# Patient Record
Sex: Male | Born: 1945 | Race: Black or African American | Hispanic: No | Smoking: Former smoker
Health system: Southern US, Community
[De-identification: ages and names within clinical notes are randomized; demographics above are authoritative.]

## PROBLEM LIST (undated history)

## (undated) DIAGNOSIS — I10 Essential (primary) hypertension: Secondary | ICD-10-CM

## (undated) DIAGNOSIS — C801 Malignant (primary) neoplasm, unspecified: Secondary | ICD-10-CM

## (undated) DIAGNOSIS — E349 Endocrine disorder, unspecified: Secondary | ICD-10-CM

## (undated) DIAGNOSIS — F419 Anxiety disorder, unspecified: Secondary | ICD-10-CM

## (undated) DIAGNOSIS — Z9889 Other specified postprocedural states: Secondary | ICD-10-CM

## (undated) DIAGNOSIS — K224 Dyskinesia of esophagus: Secondary | ICD-10-CM

## (undated) DIAGNOSIS — J302 Other seasonal allergic rhinitis: Secondary | ICD-10-CM

## (undated) DIAGNOSIS — E119 Type 2 diabetes mellitus without complications: Secondary | ICD-10-CM

## (undated) DIAGNOSIS — G4730 Sleep apnea, unspecified: Secondary | ICD-10-CM

## (undated) DIAGNOSIS — C61 Malignant neoplasm of prostate: Secondary | ICD-10-CM

## (undated) HISTORY — DX: Malignant neoplasm of prostate: C61

## (undated) HISTORY — PX: MOUTH SURGERY: SHX715

## (undated) HISTORY — PX: TONSILLECTOMY: SUR1361

---

## 2005-07-28 ENCOUNTER — Ambulatory Visit: Payer: Self-pay | Admitting: Family Medicine

## 2005-08-06 ENCOUNTER — Ambulatory Visit: Payer: Self-pay | Admitting: Family Medicine

## 2007-09-30 ENCOUNTER — Ambulatory Visit: Payer: Self-pay | Admitting: Gastroenterology

## 2010-06-25 ENCOUNTER — Ambulatory Visit: Payer: Self-pay | Admitting: Family Medicine

## 2010-07-19 ENCOUNTER — Other Ambulatory Visit (HOSPITAL_COMMUNITY): Payer: BC Managed Care – PPO

## 2010-07-23 ENCOUNTER — Ambulatory Visit (HOSPITAL_COMMUNITY): Admission: RE | Admit: 2010-07-23 | Payer: BC Managed Care – PPO | Source: Ambulatory Visit | Admitting: Neurosurgery

## 2012-07-28 ENCOUNTER — Ambulatory Visit: Payer: Self-pay | Admitting: Family Medicine

## 2013-04-23 ENCOUNTER — Emergency Department: Payer: Self-pay | Admitting: Internal Medicine

## 2014-11-19 ENCOUNTER — Emergency Department
Admission: EM | Admit: 2014-11-19 | Discharge: 2014-11-19 | Disposition: A | Discharge status: Home / Self Care | Payer: Medicare Other | Attending: Emergency Medicine | Admitting: Emergency Medicine

## 2014-11-19 ENCOUNTER — Encounter: Payer: Self-pay | Admitting: Emergency Medicine

## 2014-11-19 DIAGNOSIS — M7072 Other bursitis of hip, left hip: Secondary | ICD-10-CM | POA: Insufficient documentation

## 2014-11-19 DIAGNOSIS — E119 Type 2 diabetes mellitus without complications: Secondary | ICD-10-CM | POA: Diagnosis not present

## 2014-11-19 DIAGNOSIS — Z720 Tobacco use: Secondary | ICD-10-CM | POA: Insufficient documentation

## 2014-11-19 DIAGNOSIS — M25552 Pain in left hip: Secondary | ICD-10-CM | POA: Diagnosis present

## 2014-11-19 DIAGNOSIS — Y9389 Activity, other specified: Secondary | ICD-10-CM | POA: Diagnosis not present

## 2014-11-19 HISTORY — DX: Type 2 diabetes mellitus without complications: E11.9

## 2014-11-19 MED ORDER — MELOXICAM 15 MG PO TABS: 15.0000 mg | ORAL_TABLET | Freq: Every day | ORAL | Status: DC

## 2014-11-19 NOTE — ED Notes (Signed)
Left hip pain this am  States he did a lot of exercising and walking yesterday and developed pain this am pain is non radiating

## 2014-11-19 NOTE — ED Notes (Signed)
Patient reports left hip pain for a couple years getting much worse after mowing yesterday.  Patient denies any injury or fall.

## 2014-11-19 NOTE — ED Provider Notes (Signed)
[facility] Emergency Department Provider Note  ____________________________________________  Time seen: Approximately 10:58 AM  I have reviewed the triage vital signs and the nursing notes.   HISTORY  Chief Complaint Hip Pain    HPI Jacob Boyer is a 69 y.o. male presents to the emergency department complaining of left hip pain. 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 States that yesterday he was out and was mowing his 2 acre lawn with a push more. Last night he had some tenderness to his left hip and this morning he awoke with an increased pain. He denies taking any over-the-counter medications prior to arrival. He has never received cortisone injections into hip.   Past Medical History  Diagnosis Date  . Diabetes mellitus without complication ([place])     There are no active problems to display for this patient.   History reviewed. No pertinent past surgical history.  Current Outpatient Rx  Name  Route  Sig  Dispense  Refill  . meloxicam (MOBIC) 15 MG tablet   Oral   Take 1 tablet (15 mg total) by mouth daily.   30 tablet   0     Allergies Review of patient's allergies indicates no known allergies.  No family history on file.  Social History Social History  Substance Use Topics  . Smoking status: Current Some Day Smoker  . Smokeless tobacco: None  . Alcohol Use: Yes     Comment: occassional    Review of Systems Constitutional: No fever/chills Eyes: No visual changes. ENT: No sore throat. Cardiovascular: Denies chest pain. Respiratory: Denies shortness of breath. Gastrointestinal: No abdominal pain.  No nausea, no vomiting.  No diarrhea.  No constipation. Genitourinary: Negative for dysuria. Musculoskeletal: Negative for back pain. Endorses left hip pain. Skin: Negative for rash. Neurological: Negative for headaches, focal weakness or numbness.  10-point ROS otherwise  negative.  ____________________________________________   PHYSICAL EXAM:  VITAL SIGNS: ED Triage Vitals  Enc Vitals Group     BP 11/19/14 1036 133/85 mmHg     Pulse Rate 11/19/14 1036 85     Resp 11/19/14 1036 20     Temp 11/19/14 1036 98.1 F (36.7 C)     Temp Source 11/19/14 1036 Oral     SpO2 11/19/14 1036 98 %     Weight 11/19/14 1036 210 lb (95.255 kg)     Height 11/19/14 1036 5\' 6"  (1.676 m)     Head Cir --      Peak Flow --      Pain Score 11/19/14 1037 8     Pain Loc --      Pain Edu? --      Excl. in [place]? --     Constitutional: Alert and oriented. Well appearing and in no acute distress. Eyes: Conjunctivae are normal. PERRL. EOMI. Head: Atraumatic. Nose: No congestion/rhinnorhea. Mouth/Throat: Mucous membranes are moist.  Oropharynx non-erythematous. Neck: No stridor.   Cardiovascular: Normal rate, regular rhythm. Grossly normal heart sounds.  Good peripheral circulation. Respiratory: Normal respiratory effort.  No retractions. Lungs CTAB. Gastrointestinal: Soft and nontender. No distention. No abdominal bruits. No CVA tenderness. Musculoskeletal: No lower extremity tenderness nor edema.  No joint effusions. No visible abnormalities. Nontender to palpation over spine or paraspinal muscles. Patient does endorse tenderness when palpating greater trochanter. Range of motion is intact. Sensation and strength intact bilateral lower extremities. Neurologic:  Normal speech and language. No gross focal neurologic deficits are appreciated. No gait instability. Skin:  Skin is warm, dry and intact. No rash noted. Psychiatric: Mood and affect are normal. Speech and behavior are normal.  ____________________________________________   LABS (all labs ordered are listed, but only abnormal results are displayed)  Labs Reviewed - No data to  display ____________________________________________  EKG   ____________________________________________  RADIOLOGY   ____________________________________________   PROCEDURES  Procedure(s) performed: None  Critical Care performed: No  ____________________________________________   INITIAL IMPRESSION / ASSESSMENT AND PLAN / ED COURSE  Pertinent labs & imaging results that were available during my care of the patient were reviewed by me and considered in my medical decision making (see chart for details).  The patient's history, symptoms, exam is most consistent with left hip bursitis. Discussed findings and diagnosis with patient. He verbalized understanding of same. Advised the patient that I would place him on meloxicam for symptom relief. Gave instructions not to use any other NSAIDs on this medication. Instructed the patient to follow-up with orthopedics in 2-3 weeks if symptoms are not resolved with use of medication. Patient verbalized understanding of diagnosis, treatment plan, and verbalized compliance with same. ____________________________________________   FINAL CLINICAL IMPRESSION(S) / ED DIAGNOSES  Final diagnoses:  Bursitis of hip, left      Darletta Moll, PA-C 11/19/14 1126  Schuyler Amor, MD 11/19/14 1234

## 2014-11-19 NOTE — ED Notes (Signed)
States he is having left hip pain  Denies any injury but did do a lot of exercising yesterday  Ambulates with slight limp to triage

## 2014-11-19 NOTE — Discharge Instructions (Signed)
Bursitis °Bursitis is a swelling and soreness (inflammation) of a fluid-filled sac (bursa) that overlies and protects a joint. It can be caused by injury, overuse of the joint, arthritis or infection. The joints most likely to be affected are the elbows, shoulders, hips and knees. °HOME CARE INSTRUCTIONS  °· Apply ice to the affected area for 15-20 minutes each hour while awake for 2 days. Put the ice in a plastic bag and place a towel between the bag of ice and your skin. °· Rest the injured joint as much as possible, but continue to put the joint through a full range of motion, 4 times per day. (The shoulder joint especially becomes rapidly "frozen" if not used.) When the pain lessens, begin normal slow movements and usual activities. °· Only take over-the-counter or prescription medicines for pain, discomfort or fever as directed by your caregiver. °· Your caregiver may recommend draining the bursa and injecting medicine into the bursa. This may help the healing process. °· Follow all instructions for follow-up with your caregiver. This includes any orthopedic referrals, physical therapy and rehabilitation. Any delay in obtaining necessary care could result in a delay or failure of the bursitis to heal and chronic pain. °SEEK IMMEDIATE MEDICAL CARE IF:  °· Your pain increases even during treatment. °· You develop an oral temperature above 102° F (38.9° C) and have heat and inflammation over the involved bursa. °MAKE SURE YOU:  °· Understand these instructions. °· Will watch your condition. °· Will get help right away if you are not doing well or get worse. °Document Released: 02/01/2000 Document Revised: 04/28/2011 Document Reviewed: 04/25/2013 °ExitCare® Patient Information ©2015 [employer]. This information is not intended to replace advice given to you by your health care provider. Make sure you discuss any questions you have with your health care provider. ° °

## 2015-01-29 ENCOUNTER — Ambulatory Visit
Admission: RE | Admit: 2015-01-29 | Discharge: 2015-01-29 | Disposition: A | Discharge status: Home / Self Care | Payer: Medicare Other | Source: Ambulatory Visit | Attending: Physician Assistant | Admitting: Physician Assistant

## 2015-01-29 ENCOUNTER — Other Ambulatory Visit: Payer: Self-pay | Admitting: Physician Assistant

## 2015-01-29 DIAGNOSIS — R1319 Other dysphagia: Secondary | ICD-10-CM

## 2015-01-29 DIAGNOSIS — R1310 Dysphagia, unspecified: Secondary | ICD-10-CM | POA: Insufficient documentation

## 2015-01-29 DIAGNOSIS — Z9889 Other specified postprocedural states: Secondary | ICD-10-CM | POA: Insufficient documentation

## 2015-01-29 DIAGNOSIS — K449 Diaphragmatic hernia without obstruction or gangrene: Secondary | ICD-10-CM | POA: Diagnosis not present

## 2015-03-05 ENCOUNTER — Encounter: Payer: Self-pay | Admitting: *Deleted

## 2015-03-06 ENCOUNTER — Encounter: Payer: Self-pay | Admitting: *Deleted

## 2015-03-06 ENCOUNTER — Ambulatory Visit: Payer: Medicare Other | Admitting: *Deleted

## 2015-03-06 ENCOUNTER — Encounter
Admission: RE | Disposition: A | Discharge status: Home / Self Care | Payer: Self-pay | Source: Ambulatory Visit | Attending: Gastroenterology

## 2015-03-06 ENCOUNTER — Ambulatory Visit
Admission: RE | Admit: 2015-03-06 | Discharge: 2015-03-06 | Disposition: A | Discharge status: Home / Self Care | Payer: Medicare Other | Source: Ambulatory Visit | Attending: Gastroenterology | Admitting: Gastroenterology

## 2015-03-06 DIAGNOSIS — D124 Benign neoplasm of descending colon: Secondary | ICD-10-CM | POA: Insufficient documentation

## 2015-03-06 DIAGNOSIS — Z1211 Encounter for screening for malignant neoplasm of colon: Secondary | ICD-10-CM | POA: Insufficient documentation

## 2015-03-06 DIAGNOSIS — Z794 Long term (current) use of insulin: Secondary | ICD-10-CM | POA: Insufficient documentation

## 2015-03-06 DIAGNOSIS — D125 Benign neoplasm of sigmoid colon: Secondary | ICD-10-CM | POA: Diagnosis not present

## 2015-03-06 DIAGNOSIS — K621 Rectal polyp: Secondary | ICD-10-CM | POA: Diagnosis not present

## 2015-03-06 DIAGNOSIS — Z79899 Other long term (current) drug therapy: Secondary | ICD-10-CM | POA: Diagnosis not present

## 2015-03-06 DIAGNOSIS — K5730 Diverticulosis of large intestine without perforation or abscess without bleeding: Secondary | ICD-10-CM | POA: Insufficient documentation

## 2015-03-06 DIAGNOSIS — E119 Type 2 diabetes mellitus without complications: Secondary | ICD-10-CM | POA: Insufficient documentation

## 2015-03-06 DIAGNOSIS — I10 Essential (primary) hypertension: Secondary | ICD-10-CM | POA: Diagnosis not present

## 2015-03-06 DIAGNOSIS — G709 Myoneural disorder, unspecified: Secondary | ICD-10-CM | POA: Insufficient documentation

## 2015-03-06 DIAGNOSIS — Z7984 Long term (current) use of oral hypoglycemic drugs: Secondary | ICD-10-CM | POA: Diagnosis not present

## 2015-03-06 HISTORY — DX: Essential (primary) hypertension: I10

## 2015-03-06 HISTORY — DX: Endocrine disorder, unspecified: E34.9

## 2015-03-06 HISTORY — DX: Other seasonal allergic rhinitis: J30.2

## 2015-03-06 HISTORY — DX: Other specified postprocedural states: Z98.890

## 2015-03-06 HISTORY — DX: Dyskinesia of esophagus: K22.4

## 2015-03-06 HISTORY — PX: COLONOSCOPY WITH PROPOFOL: SHX5780

## 2015-03-06 LAB — GLUCOSE, CAPILLARY: Glucose-Capillary: 98 mg/dL (ref 65–99)

## 2015-03-06 SURGERY — COLONOSCOPY WITH PROPOFOL
Anesthesia: General

## 2015-03-06 MED ORDER — SODIUM CHLORIDE 0.9 % IV SOLN: INTRAVENOUS | Status: DC

## 2015-03-06 MED ORDER — PROPOFOL 10 MG/ML IV BOLUS
INTRAVENOUS | Status: DC | PRN
  Administered 2015-03-06: 120 mg via INTRAVENOUS
  Administered 2015-03-06: 40 mg via INTRAVENOUS

## 2015-03-06 MED ORDER — SODIUM CHLORIDE 0.9 % IV SOLN
INTRAVENOUS | Status: DC
  Administered 2015-03-06: 13:00:00 via INTRAVENOUS

## 2015-03-06 NOTE — Anesthesia Preprocedure Evaluation (Signed)
Anesthesia Evaluation  Patient identified by MRN, date of birth, ID band Patient awake    Reviewed: Allergy & Precautions, H&P , NPO status , Patient's Chart, lab work & pertinent test results  History of Anesthesia Complications Negative for: history of anesthetic complications  Airway Mallampati: III  TM Distance: >3 FB Neck ROM: limited    Dental  (+) Poor Dentition, Chipped   Pulmonary neg shortness of breath, former smoker,    Pulmonary exam normal breath sounds clear to auscultation       Cardiovascular Exercise Tolerance: Good hypertension, (-) angina(-) Past MI Normal cardiovascular exam Rhythm:regular Rate:Normal     Neuro/Psych  Neuromuscular disease negative psych ROS   GI/Hepatic negative GI ROS, Neg liver ROS,   Endo/Other  diabetes, Poorly Controlled  Renal/GU negative Renal ROS  negative genitourinary   Musculoskeletal   Abdominal   Peds  Hematology negative hematology ROS (+)   Anesthesia Other Findings Past Medical History:   Diabetes mellitus without complication (HCC)                 Esophageal motility disorder                                 Hypertension                                                 Hypotestosteronism                                           Seasonal allergic rhinitis                                   H/O cervical spine surgery                                  Past Surgical History:   TONSILLECTOMY                                                BMI    Body Mass Index   33.91 kg/m 2      Reproductive/Obstetrics negative OB ROS                             Anesthesia Physical Anesthesia Plan  ASA: III  Anesthesia Plan: General   Post-op Pain Management:    Induction:   Airway Management Planned:   Additional Equipment:   Intra-op Plan:   Post-operative Plan:   Informed Consent: I have reviewed the patients History and  Physical, chart, labs and discussed the procedure including the risks, benefits and alternatives for the proposed anesthesia with the patient or authorized representative who has indicated his/her understanding and acceptance.   Dental Advisory Given  Plan Discussed with: Anesthesiologist, CRNA and Surgeon  Anesthesia Plan Comments:         Anesthesia Quick Evaluation

## 2015-03-06 NOTE — H&P (Signed)
Outpatient short stay form Pre-procedure 03/06/2015 1:16 PM Lollie Sails MD  Primary Physician: Dr. Juluis Pitch   Reason for visit:  Colonoscopy  History of present illness:  Patient is a 70 year old male presenting today for colonoscopy. He has a personal history of adenomatous colon polyps with his last colonoscopy in 2009. He tolerated his prep well. He takes no aspirin products or blood thinners.     Current facility-administered medications:  .  0.9 %  sodium chloride infusion, , Intravenous, Continuous, Lollie Sails, MD, Last Rate: 20 mL/hr at 03/06/15 1310 .  0.9 %  sodium chloride infusion, , Intravenous, Continuous, Lollie Sails, MD  Prescriptions prior to admission  Medication Sig Dispense Refill Last Dose  . fluticasone (FLONASE) 50 MCG/ACT nasal spray Place into both nostrils daily.   03/05/2015 at Unknown time  . Fluticasone-Salmeterol (ADVAIR) 250-50 MCG/DOSE AEPB Inhale 1 puff into the lungs 2 (two) times daily.   03/05/2015 at Unknown time  . glimepiride (AMARYL) 4 MG tablet Take 4 mg by mouth 2 (two) times daily.   03/05/2015 at Unknown time  . insulin glargine (LANTUS) 100 UNIT/ML injection Inject 25 Units into the skin daily.   03/05/2015 at Unknown time  . loratadine-pseudoephedrine (CLARITIN-D 24-HOUR) 10-240 MG 24 hr tablet Take 1 tablet by mouth daily.   03/05/2015 at Unknown time  . losartan-hydrochlorothiazide (HYZAAR) 100-25 MG tablet Take 1 tablet by mouth daily.   03/05/2015 at Unknown time  . meloxicam (MOBIC) 15 MG tablet Take 1 tablet (15 mg total) by mouth daily. 30 tablet 0 03/05/2015 at Unknown time  . metFORMIN (GLUCOPHAGE) 850 MG tablet Take 850 mg by mouth 3 (three) times daily.   03/05/2015 at Unknown time  . omeprazole (PRILOSEC) 20 MG capsule Take 20 mg by mouth 2 (two) times daily.   03/05/2015 at Unknown time  . pioglitazone (ACTOS) 45 MG tablet Take 45 mg by mouth daily.   03/05/2015 at Unknown time  . tamsulosin (FLOMAX) 0.4 MG CAPS  capsule Take 0.4 mg by mouth daily.   03/05/2015 at Unknown time     No Known Allergies   Past Medical History  Diagnosis Date  . Diabetes mellitus without complication ([place])   . Esophageal motility disorder   . Hypertension   . Hypotestosteronism   . Seasonal allergic rhinitis   . H/O cervical spine surgery     Review of systems:      Physical Exam    Heart and lungs: Regular rate and rhythm without rub or gallop lungs are bilaterally clear.    HEENT: Normocephalic atraumatic eyes are anicteric    Other:     Pertinant exam for procedure: Soft nontender nondistended bowel sounds positive normoactive.    Planned proceedures: Colonoscopy and indicated procedures. I have discussed the risks benefits and complications of procedures to include not limited to bleeding, infection, perforation and the risk of sedation and the patient wishes to proceed.    Lollie Sails, MD Gastroenterology 03/06/2015  1:16 PM

## 2015-03-06 NOTE — Transfer of Care (Signed)
Immediate Anesthesia Transfer of Care Note  Patient: Jacob Boyer  Procedure(s) Performed: Procedure(s): COLONOSCOPY WITH PROPOFOL (N/A)  Patient Location: PACU  Anesthesia Type:General  Level of Consciousness: awake, alert  and oriented  Airway & Oxygen Therapy: Patient Spontanous Breathing and Patient connected to nasal cannula oxygen  Post-op Assessment: Report given to RN and Post -op Vital signs reviewed and stable  Post vital signs: Reviewed and stable  Last Vitals:  Filed Vitals:   03/06/15 1249 03/06/15 1409  BP: 147/91 125/89  Pulse: 71 75  Temp: 36 C 35.8 C  Resp: 18 16    Complications: No apparent anesthesia complications

## 2015-03-06 NOTE — Anesthesia Postprocedure Evaluation (Signed)
Anesthesia Post Note  Patient: Jacob Boyer  Procedure(s) Performed: Procedure(s) (LRB): COLONOSCOPY WITH PROPOFOL (N/A)  Patient location during evaluation: Endoscopy Anesthesia Type: General Level of consciousness: awake and alert Pain management: pain level controlled Vital Signs Assessment: post-procedure vital signs reviewed and stable Respiratory status: spontaneous breathing, nonlabored ventilation, respiratory function stable and patient connected to nasal cannula oxygen Cardiovascular status: blood pressure returned to baseline and stable Postop Assessment: no signs of nausea or vomiting Anesthetic complications: no    Last Vitals:  Filed Vitals:   03/06/15 1429 03/06/15 1439  BP: 131/89 137/95  Pulse: 53 76  Temp:    Resp: 15 15    Last Pain: There were no vitals filed for this visit.               Precious Haws Zaylyn Bergdoll

## 2015-03-06 NOTE — Op Note (Signed)
[facility] Patient Name: Jacob Boyer Procedure Date: 03/06/2015 1:28 PM MRN: [id number] Account #: [id number] Date of Birth: 01/10/1946 Admit Type: Outpatient Age: 70 Room: [facility] ENDO ROOM 3 Gender: Male Note Status: Finalized Procedure:         Colonoscopy Indications:       Personal history of colonic polyps Providers:         Lollie Sails, MD Referring MD:      Youlanda Roys. Lovie Macadamia, MD (Referring MD) Medicines:         Monitored Anesthesia Care Complications:     No immediate complications. Procedure:         Pre-Anesthesia Assessment:                    - ASA Grade Assessment: III - A patient with severe                     systemic disease.                    After obtaining informed consent, the colonoscope was                     passed under direct vision. Throughout the procedure, the                     patient's blood pressure, pulse, and oxygen saturations                     were monitored continuously. The Colonoscope was                     introduced through the anus and advanced to the the cecum,                     identified by appendiceal orifice and ileocecal valve. The                     colonoscopy was performed without difficulty. The patient                     tolerated the procedure well. The quality of the bowel                     preparation was good. Findings:      A 5 mm polyp was found in the proximal descending colon. The polyp was       sessile. The polyp was removed with a cold snare. Resection and       retrieval were complete.      A 3 mm polyp was found in the proximal descending colon. The polyp was       sessile. The polyp was removed with a cold biopsy forceps. Resection and       retrieval were complete.      A 4 mm polyp was found in the sigmoid colon. The polyp was sessile. The       polyp was removed with a cold snare. Resection and retrieval were       complete.      A 2 mm polyp was found  in the sigmoid colon. The polyp was sessile.       These polyps were removed with a cold biopsy forceps. Resection and       retrieval were complete.      Four  sessile polyps were found in the rectum. The polyps were 1 to 2 mm       in size. These polyps were removed with a cold biopsy forceps. Resection       and retrieval were complete.      Multiple small-mouthed diverticula were found in the sigmoid colon and       in the descending colon.      The digital rectal exam was normal. Impression:        - One 5 mm polyp in the proximal descending colon.                     Resected and retrieved.                    - One 3 mm polyp in the proximal descending colon.                     Resected and retrieved.                    - One 4 mm polyp in the sigmoid colon. Resected and                     retrieved.                    - One 2 mm polyp in the sigmoid colon. Resected and                     retrieved.                    - Four 1 to 2 mm polyps in the rectum. Resected and                     retrieved.                    - Diverticulosis in the sigmoid colon and in the                     descending colon. Recommendation:    - Discharge patient to home.                    - Telephone GI clinic for pathology results in 1 week. Procedure Code(s): --- Professional ---                    [phone], Colonoscopy, flexible; with removal of tumor(s),                     polyp(s), or other lesion(s) by snare technique                    [id number], 50, Colonoscopy, flexible; with biopsy, single or                     multiple Diagnosis Code(s): --- Professional ---                    D12.4, Benign neoplasm of descending colon                    D12.5, Benign neoplasm of sigmoid colon                    K62.1, Rectal polyp  Z86.010, Personal history of colonic polyps                    K57.30, Diverticulosis of large intestine without                     perforation or abscess  without bleeding CPT copyright 2014 [employer]. All rights reserved. The codes documented in this report are preliminary and upon coder review may  be revised to meet current compliance requirements. Lollie Sails, MD 03/06/2015 2:07:56 PM This report has been signed electronically. Number of Addenda: 0 Note Initiated On: 03/06/2015 1:28 PM Scope Withdrawal Time: 0 hours 17 minutes 7 seconds  Total Procedure Duration: 0 hours 27 minutes 22 seconds       [facility]

## 2015-03-07 ENCOUNTER — Encounter: Payer: Self-pay | Admitting: Gastroenterology

## 2015-03-08 LAB — SURGICAL PATHOLOGY

## 2017-02-05 DIAGNOSIS — Z794 Long term (current) use of insulin: Secondary | ICD-10-CM | POA: Insufficient documentation

## 2017-02-05 DIAGNOSIS — E119 Type 2 diabetes mellitus without complications: Secondary | ICD-10-CM | POA: Insufficient documentation

## 2017-08-18 ENCOUNTER — Encounter: Payer: Self-pay | Admitting: Urology

## 2017-08-18 ENCOUNTER — Ambulatory Visit (INDEPENDENT_AMBULATORY_CARE_PROVIDER_SITE_OTHER): Payer: Medicare Other | Admitting: Urology

## 2017-08-18 VITALS — BP 130/84 | HR 82 | Ht 66.0 in | Wt 210.0 lb

## 2017-08-18 DIAGNOSIS — R972 Elevated prostate specific antigen [PSA]: Secondary | ICD-10-CM | POA: Diagnosis not present

## 2017-08-18 DIAGNOSIS — N138 Other obstructive and reflux uropathy: Secondary | ICD-10-CM | POA: Diagnosis not present

## 2017-08-18 DIAGNOSIS — N401 Enlarged prostate with lower urinary tract symptoms: Secondary | ICD-10-CM

## 2017-08-18 LAB — URINALYSIS, COMPLETE
BILIRUBIN UA: NEGATIVE
Ketones, UA: NEGATIVE
LEUKOCYTES UA: NEGATIVE
Nitrite, UA: NEGATIVE
PH UA: 5 (ref 5.0–7.5)
PROTEIN UA: NEGATIVE
RBC, UA: NEGATIVE
Specific Gravity, UA: 1.025 (ref 1.005–1.030)
Urobilinogen, Ur: 0.2 mg/dL (ref 0.2–1.0)

## 2017-08-18 LAB — MICROSCOPIC EXAMINATION
RBC, UA: NONE SEEN /hpf (ref 0–2)
WBC, UA: NONE SEEN /hpf (ref 0–5)

## 2017-08-18 LAB — BLADDER SCAN AMB NON-IMAGING

## 2017-08-18 MED ORDER — TAMSULOSIN HCL 0.4 MG PO CAPS: 0.4000 mg | ORAL_CAPSULE | Freq: Every day | ORAL | 11 refills | Status: DC

## 2017-08-18 NOTE — Progress Notes (Signed)
08/18/2017 11:08 AM   Jacob Boyer Jul 12, 1945 [id number]  Referring provider: Juluis Pitch, MD [phone] [place], [place] [address]  Chief Complaint  Patient presents with  . Elevated PSA    New Patient    HPI: 72 year old male who presents today for further evaluation of elevated PSA.  He underwent recent routine PSA screening and was noted to have a markedly elevated PSA to 9.06 on 07/01/2017 (repeat PSA on 07/24/2017 8.5, free PSA 0.72) this is up significantly from 3.2 in 12/2013.  UA today is unremarkable.    No family history of prostate cancer.    He does have some baseline urinary symptoms including frequency, double voiding and sensation of incomplete emptying.  His stream is fair.  He has had urinary symptoms for many years.  He is not currently on BPH meds but may have tried Flomax several years ago.    No history recent UTIs or bladder stones.  He has never seen Urology.    No weight loss or bone pain.   IPSS    Row Name 08/18/17 1300         International Prostate Symptom Score   How often have you had the sensation of not emptying your bladder?  More than half the time     How often have you had to urinate less than every two hours?  More than half the time     How often have you found you stopped and started again several times when you urinated?  More than half the time     How often have you found it difficult to postpone urination?  Almost always     How often have you had a weak urinary stream?  Less than 1 in 5 times     How often have you had to strain to start urination?  Not at All     How many times did you typically get up at night to urinate?  3 Times     Total IPSS Score  21       Quality of Life due to urinary symptoms   If you were to spend the rest of your life with your urinary condition just the way it is now how would you feel about that?  Mostly Disatisfied        Score:  1-7 Mild 8-19 Moderate 20-35 Severe   PMH: Past  Medical History:  Diagnosis Date  . Diabetes mellitus without complication ([place])   . Esophageal motility disorder   . H/O cervical spine surgery   . Hypertension   . Hypotestosteronism   . Seasonal allergic rhinitis     Surgical History: Past Surgical History:  Procedure Laterality Date  . COLONOSCOPY WITH PROPOFOL N/A 03/06/2015   Procedure: COLONOSCOPY WITH PROPOFOL;  Surgeon: Lollie Sails, MD;  Location: [facility] ENDOSCOPY;  Service: Endoscopy;  Laterality: N/A;  . TONSILLECTOMY      Home Medications:  Allergies as of 08/18/2017   No Known Allergies     Medication List        Accurate as of 08/18/17 11:59 PM. Always use your most recent med list.          fluticasone 50 MCG/ACT nasal spray Commonly known as:  FLONASE Place into both nostrils daily.   Fluticasone-Salmeterol 250-50 MCG/DOSE Aepb Commonly known as:  ADVAIR Inhale 1 puff into the lungs 2 (two) times daily.   glimepiride 4 MG tablet Commonly known as:  AMARYL Take 4  mg by mouth 2 (two) times daily.   insulin glargine 100 UNIT/ML injection Commonly known as:  LANTUS Inject 25 Units into the skin daily.   loratadine-pseudoephedrine 10-240 MG 24 hr tablet Commonly known as:  CLARITIN-D 24-hour Take 1 tablet by mouth daily.   losartan-hydrochlorothiazide 100-25 MG tablet Commonly known as:  HYZAAR Take 1 tablet by mouth daily.   metFORMIN 850 MG tablet Commonly known as:  GLUCOPHAGE Take 850 mg by mouth 3 (three) times daily.   omeprazole 20 MG capsule Commonly known as:  PRILOSEC Take 20 mg by mouth 2 (two) times daily.   pioglitazone 45 MG tablet Commonly known as:  ACTOS Take 45 mg by mouth daily.   tamsulosin 0.4 MG Caps capsule Commonly known as:  FLOMAX Take 1 capsule (0.4 mg total) by mouth daily.       Allergies: No Known Allergies  Family History: Family History  Problem Relation Age of Onset  . Prostate cancer Neg Hx   . Kidney cancer Neg Hx   . Bladder Cancer Neg Hx       Social History:  reports that he has quit smoking. His smoking use included cigarettes. He has a 2.50 pack-year smoking history. He has never used smokeless tobacco. He reports that he drinks alcohol. He reports that he does not use drugs.  ROS: UROLOGY Frequent Urination?: Yes Hard to postpone urination?: Yes Burning/pain with urination?: No Get up at night to urinate?: Yes Leakage of urine?: Yes Urine stream starts and stops?: Yes Trouble starting stream?: No Do you have to strain to urinate?: No Blood in urine?: No Urinary tract infection?: No Sexually transmitted disease?: No Injury to kidneys or bladder?: No Painful intercourse?: No Weak stream?: No Erection problems?: Yes Penile pain?: No  Gastrointestinal Nausea?: No Vomiting?: No Indigestion/heartburn?: Yes Diarrhea?: No Constipation?: No  Constitutional Fever: No Night sweats?: No Weight loss?: No Fatigue?: Yes  Skin Skin rash/lesions?: No Itching?: No  Eyes Blurred vision?: Yes Double vision?: Yes  Ears/Nose/Throat Sore throat?: No Sinus problems?: Yes  Hematologic/Lymphatic Swollen glands?: No Easy bruising?: No  Cardiovascular Leg swelling?: No Chest pain?: No  Respiratory Cough?: Yes Shortness of breath?: No  Endocrine Excessive thirst?: No  Musculoskeletal Back pain?: Yes Joint pain?: No  Neurological Headaches?: No Dizziness?: No  Psychologic Depression?: Yes Anxiety?: Yes  Physical Exam: BP 130/84   Pulse 82   Ht 5\' 6"  (1.676 m)   Wt 210 lb (95.3 kg)   BMI 33.89 kg/m   Constitutional:  Alert and oriented, No acute distress. HEENT: [place] AT, moist mucus membranes.  Trachea midline, no masses. Cardiovascular: No clubbing, cyanosis, or edema. Respiratory: Normal respiratory effort, no increased work of breathing. GI: Abdomen is soft, nontender, nondistended, no abdominal masses GU: No CVA tenderness Rectal: Normal sphincter tone, unremarkable prostate, nontender, no  nodules.  External hemorrhoids appreciated. Skin: No rashes, bruises or suspicious lesions. Neurologic: Grossly intact, no focal deficits, moving all 4 extremities. Psychiatric: Normal mood and affect.  Laboratory Data: Labs from care everywhere reviewed Creatinine 1.1 on 07/01/2017 Hemoglobin A1c 10.1 PSA as above  Urinalysis UA reviewed today, 1+ glucose, otherwise negative  Pertinent Imaging: PVR 0 cc  Assessment & Plan:    1. Elevated PSA  We reviewed the implications of an elevated PSA and the uncertainty surrounding it. In general, a man's PSA increases with age and is produced by both normal and cancerous prostate tissue. Differential for elevated PSA is BPH, prostate cancer, infection, recent intercourse/ejaculation, prostate infarction,  recent urethroscopic manipulation (foley placement/cystoscopy) and prostatitis. Management of an elevated PSA can include observation or prostate biopsy and wediscussed this in detail. We discussed that indications for prostate biopsy are defined by age and race specific PSA cutoffs as well as a PSA velocity of 0.75/year.  At this point, his rectal exam is unremarkable other than some prostamegaly.  I would like to repeat his PSA in about 3 months to ensure that his PSA is truly elevated rather than secondary to inflammation or some other factor.  He is agreeable this plan and will return in 3 months with repeat PSA.  If his PSA remains elevated at this point time, would recommend proceeding with prostate biopsy.  He is agreeable this plan.  He understands the extreme importance of follow-up as discussed.  - Urinalysis, Complete  2. BPH with obstruction/lower urinary tract symptoms Poorly controlled urinary symptoms, likely multifactorial including history of poorly controlled diabetes, underlying BPH, and behavioral factors.  We will go ahead and send him on Flomax to see if this makes any difference in his urinary symptoms.  Plan to reassess  in 3 months.  Adequate bladder emptying today.  No evidence of UTI is contributing factor.  Return in about 3 months (around 11/18/2017) for PSA prior.  Hollice Espy, MD  [facility] [address], [place], [place] [address] [phone]

## 2017-10-10 IMAGING — RF DG ESOPHAGUS
8 series · 14 of 14 positions shown · non-contrast
Comparison: None.

CLINICAL DATA: Dysphagia.

EXAM:
ESOPHOGRAM/BARIUM SWALLOW
TECHNIQUE: Combined double contrast and single contrast examination performed
using effervescent crystals, thick barium liquid, and thin barium
liquid.
FLUOROSCOPY TIME:  Radiation Exposure Index (as provided by the
fluoroscopic device): 35.0 mGy

[Series 1: fluoro_barium 2fps_bw · 0.17mm/px · 4 of 9 frames shown (1 of 8)]
[frame 2/9]
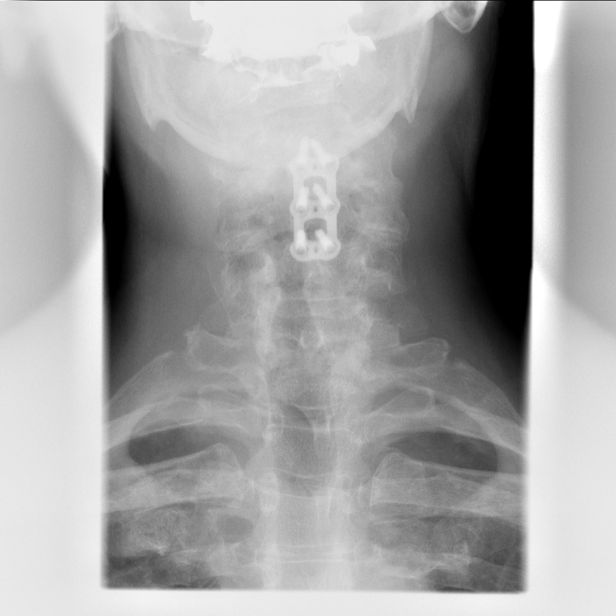
[frame 4/9]
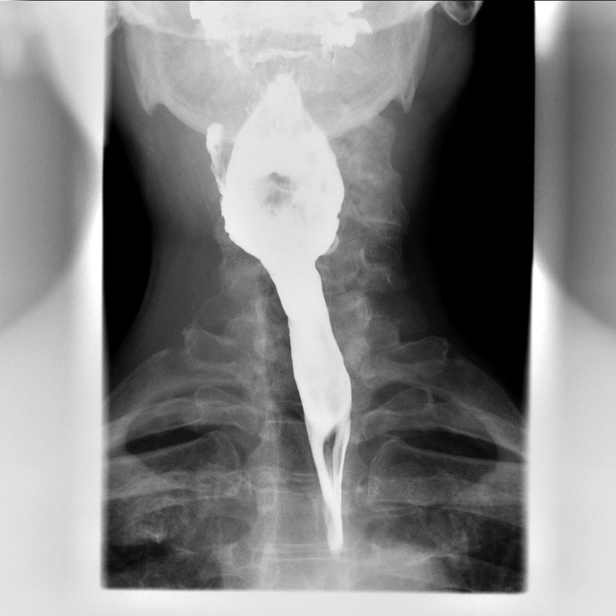
[frame 5/9]
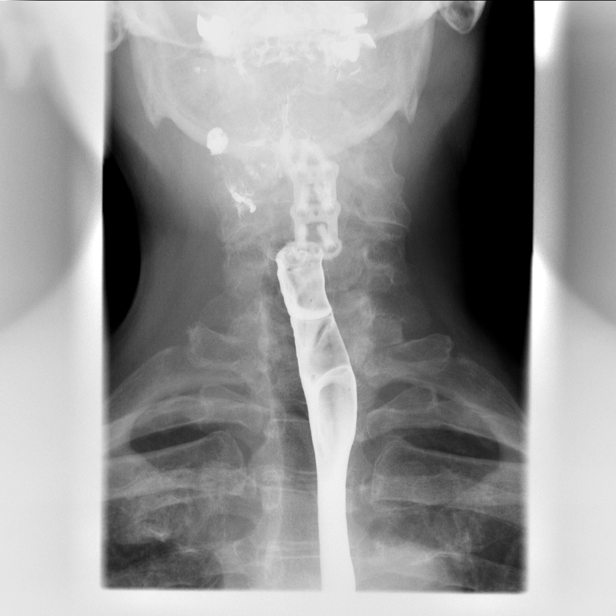
[frame 8/9]
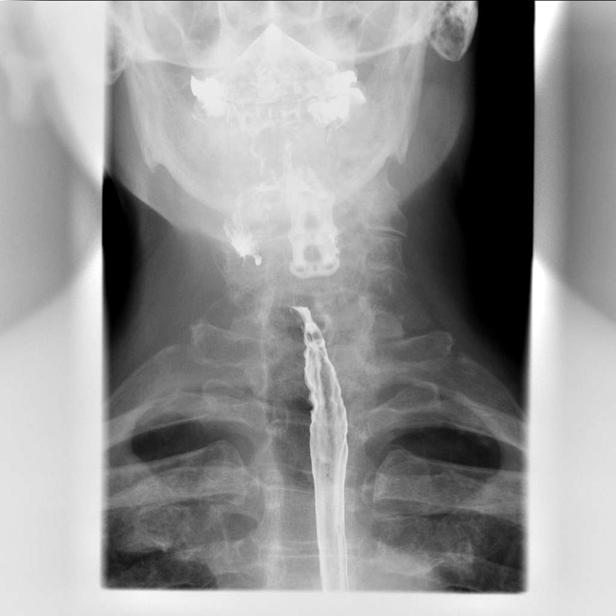

[Series 2: fluoro_barium 2fps_bw · 0.17mm/px · 4 of 8 frames shown (2 of 8)]
[frame 1/8]
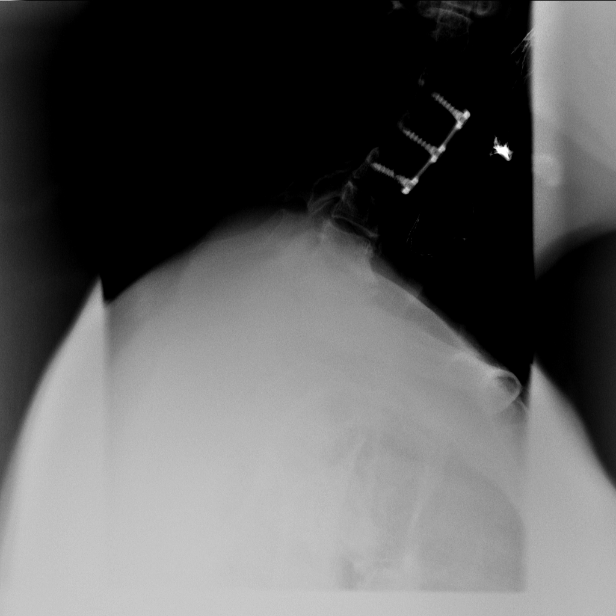
[frame 2/8]
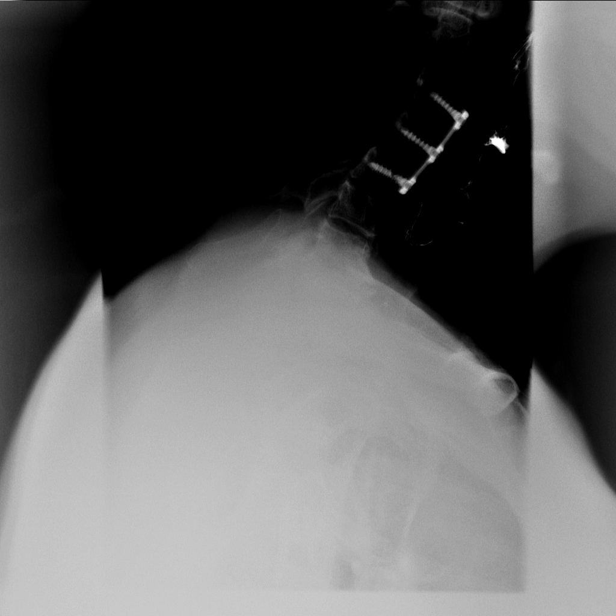
[frame 5/8]
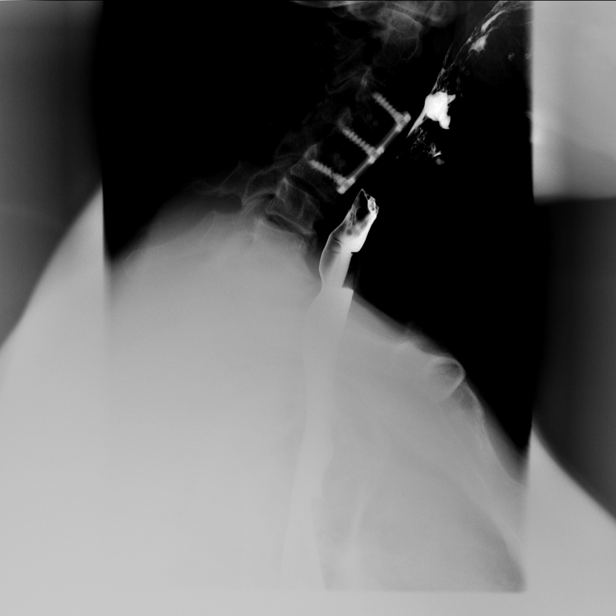
[frame 7/8]
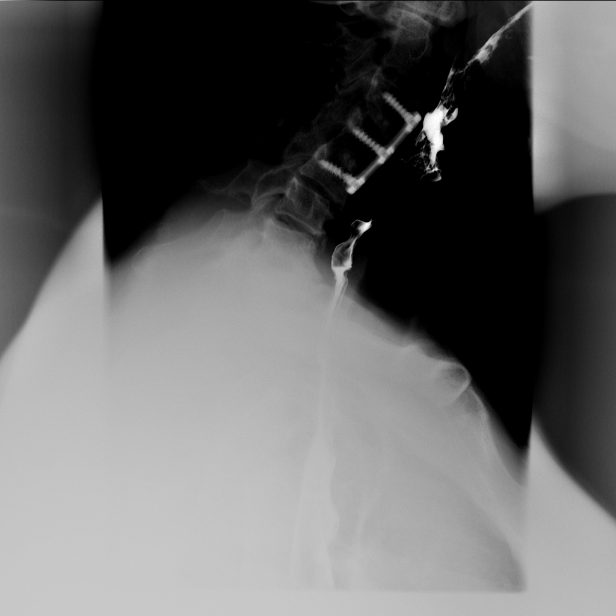

[Series 3: fluoro_barium 2fps_bw · 0.17mm/px · 1 of 1 slices shown (3 of 8)]
[im 1/1]
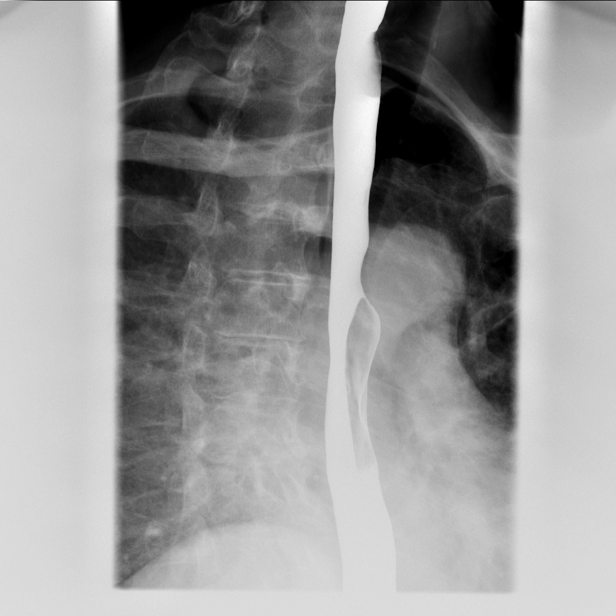

[Series 4: fluoro_barium 2fps_bw · 0.17mm/px · 1 of 1 slices shown (4 of 8)]
[im 1/1]
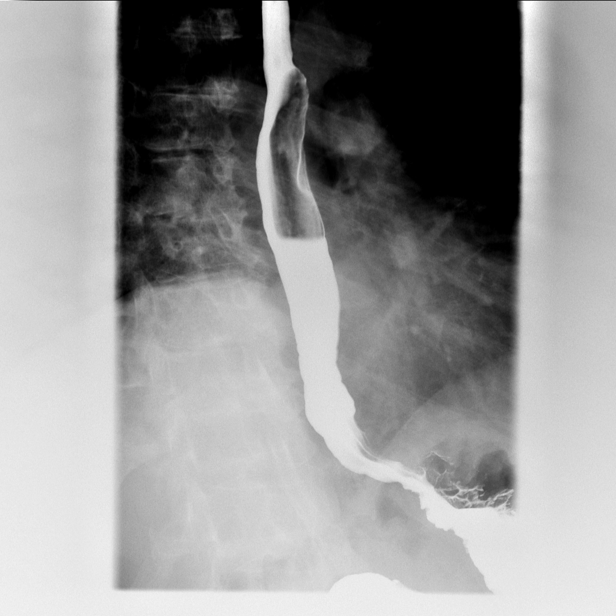

[Series 5: fluoro_barium 2fps_bw · 0.17mm/px · 1 of 1 slices shown (5 of 8)]
[im 1/1]
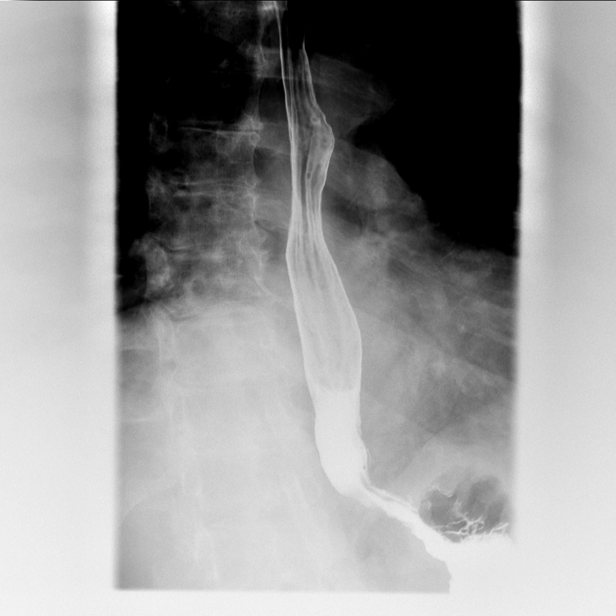

[Series 6: fluoro_barium 2fps_bw · 0.19mm/px · 1 of 1 slices shown (6 of 8)]
[im 1/1]
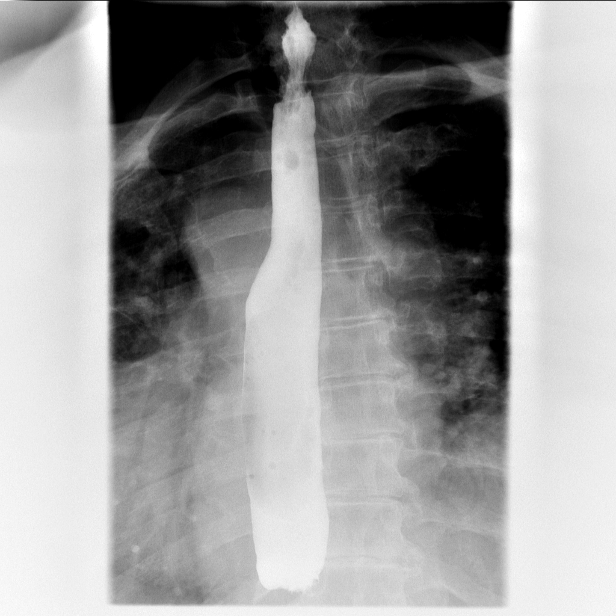

[Series 7: fluoro_barium 2fps_bw · 0.19mm/px · 1 of 1 slices shown (7 of 8)]
[im 1/1]
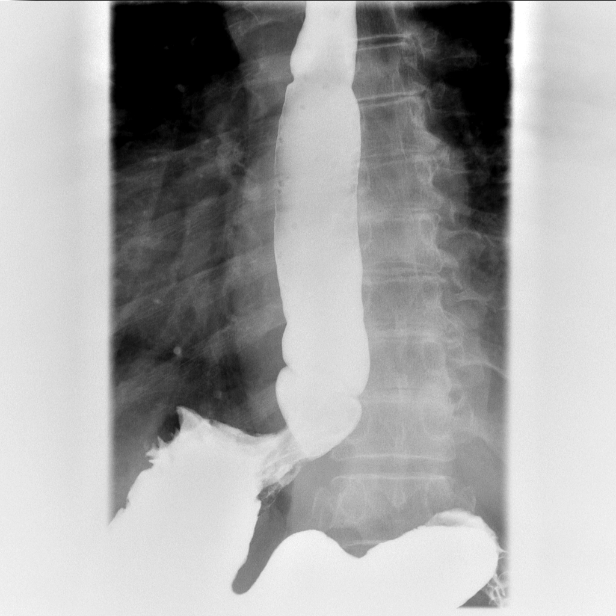

[Series 8: fluoro_barium 2fps_bw · 0.19mm/px · 1 of 1 slices shown (8 of 8)]
[im 1/1]
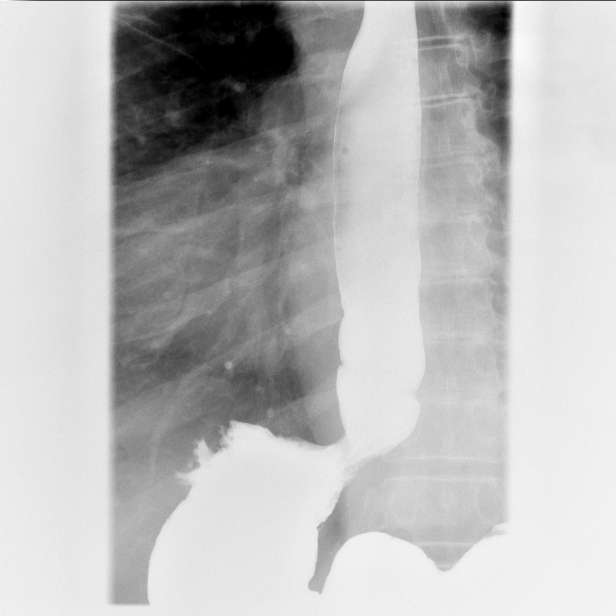

[14 of 14 positions shown; findings below may reference images not displayed]

FINDINGS: Mild deformity noted of the cervical esophagus secondary to
postsurgical changes and degenerative changes of the cervical spine.
Patient's loss normal cervical lordosis. Esophagus is widely patent.
There is a small sliding hiatal hernia with small B ring. Barium
tablet passed easily. No reflux.
IMPRESSION: 1. Mild deformity noted the cervical esophagus secondary to
postsurgical changes and degenerative changes of the cervical spine.
2. Small sliding hiatal hernia with small B ring. No obstructing
lesion identified. Standard barium tablet passed easily. No reflux .

## 2017-11-17 ENCOUNTER — Other Ambulatory Visit: Payer: Self-pay | Admitting: Family Medicine

## 2017-11-17 DIAGNOSIS — R972 Elevated prostate specific antigen [PSA]: Secondary | ICD-10-CM

## 2017-11-18 ENCOUNTER — Encounter: Payer: Self-pay | Admitting: Urology

## 2017-11-18 ENCOUNTER — Other Ambulatory Visit: Payer: Medicare Other

## 2017-11-24 ENCOUNTER — Ambulatory Visit: Payer: Medicare Other | Admitting: Urology

## 2017-11-24 ENCOUNTER — Telehealth: Payer: Self-pay | Admitting: Urology

## 2017-11-24 NOTE — Telephone Encounter (Signed)
This patient was on my schedule today to be seen for elevated PSA.  He fell off my schedule, presumably called and canceled.  Please call the patient and inform him that it is extremely imperative that he have his PSA repeated and is seen again.  His PSA was markedly elevated which is highly concerning.  Please make him aware of the possibility of missed diagnosis of prostate cancer.  Hollice Espy, MD

## 2017-11-25 NOTE — Telephone Encounter (Signed)
His brother passed away this morning. He is preparing for a funeral.  I rescheduled him but the only thing I was able to book was 01/12/18. I did inform him that we will call him if there is a cancellation earlier.

## 2018-01-12 ENCOUNTER — Ambulatory Visit: Payer: Self-pay | Admitting: Urology

## 2018-01-21 ENCOUNTER — Other Ambulatory Visit: Payer: Medicare Other

## 2018-01-26 ENCOUNTER — Ambulatory Visit: Payer: Medicare Other | Admitting: Urology

## 2018-02-23 ENCOUNTER — Other Ambulatory Visit: Payer: Medicare Other

## 2018-02-23 DIAGNOSIS — R972 Elevated prostate specific antigen [PSA]: Secondary | ICD-10-CM

## 2018-02-24 LAB — PSA: Prostate Specific Ag, Serum: 10.5 ng/mL — ABNORMAL HIGH (ref 0.0–4.0)

## 2018-03-02 ENCOUNTER — Ambulatory Visit (INDEPENDENT_AMBULATORY_CARE_PROVIDER_SITE_OTHER): Payer: Medicare Other | Admitting: Urology

## 2018-03-02 ENCOUNTER — Encounter: Payer: Self-pay | Admitting: Urology

## 2018-03-02 VITALS — BP 158/99 | HR 82 | Ht 66.0 in | Wt 185.0 lb

## 2018-03-02 DIAGNOSIS — R972 Elevated prostate specific antigen [PSA]: Secondary | ICD-10-CM | POA: Diagnosis not present

## 2018-03-02 DIAGNOSIS — N138 Other obstructive and reflux uropathy: Secondary | ICD-10-CM | POA: Diagnosis not present

## 2018-03-02 DIAGNOSIS — N401 Enlarged prostate with lower urinary tract symptoms: Secondary | ICD-10-CM

## 2018-03-02 NOTE — Progress Notes (Signed)
03/02/2018  2:30 PM   Jacob Boyer 1945-05-30 [id number]  Referring provider: Juluis Pitch, MD [phone] [place], [place] [address]  Chief Complaint  Patient presents with  . Elevated PSA    3 mo follow up   HPI: Jacob Boyer is a 73 y.o. male who returns today to discuss his options following his elevated PSA. His last appointment was 08/18/2017. He was scheduled to follow up sooner, but canceled multiple appointments due to family emergencies.  His most recent PSA (02/23/18) was 10.5. His PSA has been steadily increasing which raises concerns (see below).  Patient denies anticoagulant usage.   Patient reports that when he tried using Flomax he noted no change in his urinary symptoms. He has since discontinued use.  He has implemented diet and lifestyle changes that he believes have contributed to the improvement of his urinary symptoms. He reports a good stream. He notes that he rarely experiences frequency and has slightly improved urgency. He feels his symptoms are not bothersome enough for him to consider other pharmacological interventions at this time.  PSA Trend  12/2013 07/01/2017 07/24/2017 02/23/2018  Prostate Specific Antigen (ng/dL) 3.2 9.06 8.5 (free PSA 0.72) 10.5   PMH: Past Medical History:  Diagnosis Date  . Diabetes mellitus without complication ([place])   . Esophageal motility disorder   . H/O cervical spine surgery   . Hypertension   . Hypotestosteronism   . Seasonal allergic rhinitis     Surgical History: Past Surgical History:  Procedure Laterality Date  . COLONOSCOPY WITH PROPOFOL N/A 03/06/2015   Procedure: COLONOSCOPY WITH PROPOFOL;  Surgeon: Lollie Sails, MD;  Location: [facility] ENDOSCOPY;  Service: Endoscopy;  Laterality: N/A;  . TONSILLECTOMY      Home Medications:  Allergies as of 03/02/2018   No Known Allergies     Medication List       Accurate as of March 02, 2018  2:30 PM. Always use your most recent med list.          fluticasone 50 MCG/ACT nasal spray Commonly known as:  FLONASE Place into both nostrils daily.   Fluticasone-Salmeterol 250-50 MCG/DOSE Aepb Commonly known as:  ADVAIR Inhale 1 puff into the lungs 2 (two) times daily.   glimepiride 4 MG tablet Commonly known as:  AMARYL Take 4 mg by mouth 2 (two) times daily.   insulin glargine 100 UNIT/ML injection Commonly known as:  LANTUS Inject 25 Units into the skin daily.   loratadine-pseudoephedrine 10-240 MG 24 hr tablet Commonly known as:  CLARITIN-D 24-hour Take 1 tablet by mouth daily.   losartan-hydrochlorothiazide 100-25 MG tablet Commonly known as:  HYZAAR Take 1 tablet by mouth daily.   metFORMIN 850 MG tablet Commonly known as:  GLUCOPHAGE Take 850 mg by mouth 3 (three) times daily.   omeprazole 20 MG capsule Commonly known as:  PRILOSEC Take 20 mg by mouth 2 (two) times daily.   pioglitazone 45 MG tablet Commonly known as:  ACTOS Take 45 mg by mouth daily.   sildenafil 100 MG tablet Commonly known as:  VIAGRA Take 1/4-1 tab 30 min before activity.       Allergies: No Known Allergies  Family History: Family History  Problem Relation Age of Onset  . Prostate cancer Neg Hx   . Kidney cancer Neg Hx   . Bladder Cancer Neg Hx     Social History:  reports that he has quit smoking. His smoking use included cigarettes. He has a 2.50 pack-year smoking  history. He has never used smokeless tobacco. He reports current alcohol use. He reports that he does not use drugs.  ROS: UROLOGY Frequent Urination?: No Hard to postpone urination?: Yes Burning/pain with urination?: No Get up at night to urinate?: Yes Leakage of urine?: No Urine stream starts and stops?: Yes Trouble starting stream?: No Do you have to strain to urinate?: No Blood in urine?: No Urinary tract infection?: No Sexually transmitted disease?: No Injury to kidneys or bladder?: No Painful intercourse?: No Weak stream?: No Erection problems?:  No Penile pain?: No  Gastrointestinal Nausea?: No Vomiting?: No Indigestion/heartburn?: No Diarrhea?: No Constipation?: No  Constitutional Fever: No Night sweats?: No Weight loss?: No Fatigue?: No  Skin Skin rash/lesions?: No Itching?: No  Eyes Blurred vision?: No Double vision?: No  Ears/Nose/Throat Sore throat?: No Sinus problems?: Yes  Hematologic/Lymphatic Swollen glands?: No Easy bruising?: No  Cardiovascular Leg swelling?: No Chest pain?: No  Respiratory Cough?: No Shortness of breath?: No  Endocrine Excessive thirst?: No  Musculoskeletal Back pain?: No Joint pain?: No  Neurological Headaches?: No Dizziness?: No  Psychologic Depression?: Yes Anxiety?: No  Physical Exam: BP (!) 158/99 (BP Location: Left Arm, Patient Position: Sitting)   Pulse 82   Ht 5\' 6"  (1.676 m)   Wt 185 lb (83.9 kg) Comment: PER PATIENT  BMI 29.86 kg/m   Constitutional:  Alert and oriented, No acute distress. Respiratory: Normal respiratory effort, no increased work of breathing. GU: No CVA tenderness Skin: No rashes, bruises or suspicious lesions. Neurologic: Grossly intact, no focal deficits, moving all 4 extremities. Psychiatric: Normal mood and affect.  Laboratory Data:  12/2013 07/01/2017 07/24/2017 02/23/18  Prostate Specific Antigen (ng/dL) 3.2 9.06 8.5 (free PSA 0.72) 10.5    Assessment & Plan:    1. Elevated PSA His most recent PSA (02/23/18) was 10.5ng/dL. His PSA trend shows gradually increasing PSA. We discussed the possible causes of his elevating PSA, and concerns for prostate cancer. We discussed prostate biopsy in detail including the procedure itself, the risks of blood in the urine, stool, and ejaculate, serious infection, and discomfort. He is willing to proceed with this as discussed.  2. BPH with LUTS Started on Flomax at his last visit but he has since stopped using it as it was ineffective in relieving his symptoms Patient reports his  symptoms are much more manageable due to diet and lifestyle changes  Return for prostate biopsy.  Hollice Espy, MD  [facility] [address] [place], [place] [address] [phone]  I, Stephania Fragmin , am acting as a scribe for Hollice Espy, MD  I have reviewed the above documentation for accuracy and completeness, and I agree with the above.   Hollice Espy, MD

## 2018-03-02 NOTE — Patient Instructions (Signed)

## 2019-07-15 DIAGNOSIS — R972 Elevated prostate specific antigen [PSA]: Secondary | ICD-10-CM | POA: Insufficient documentation

## 2019-08-24 ENCOUNTER — Other Ambulatory Visit: Payer: Self-pay

## 2019-08-24 ENCOUNTER — Encounter: Payer: Self-pay | Admitting: Urology

## 2019-08-24 ENCOUNTER — Ambulatory Visit: Payer: Medicare PPO | Admitting: Urology

## 2019-08-24 VITALS — BP 161/94 | HR 92 | Ht 65.0 in | Wt 208.0 lb

## 2019-08-24 DIAGNOSIS — R972 Elevated prostate specific antigen [PSA]: Secondary | ICD-10-CM | POA: Diagnosis not present

## 2019-08-24 NOTE — Patient Instructions (Signed)
Prostate Cancer Screening  Prostate cancer screening is a test that is done to check for the presence of prostate cancer in men. The prostate gland is a walnut-sized gland that is located below the bladder and in front of the rectum in males. The function of the prostate is to add fluid to semen during ejaculation. Prostate cancer is the second most common type of cancer in men. Who should have prostate cancer screening?  Screening recommendations vary based on age and other risk factors. Screening is recommended if:  You are older than age 55. If you are age 55-69, talk with your health care provider about your need for screening and how often screening should be done. Because most prostate cancers are slow growing and will not cause death, screening is generally reserved in this age group for men who have a 10-15-year life expectancy.  You are younger than age 55, and you have these risk factors: ? Being a black male or a male of African descent. ? Having a father, brother, or uncle who has been diagnosed with prostate cancer. The risk is higher if your family member's cancer occurred at an early age. Screening is not recommended if:  You are younger than age 40.  You are between the ages of 40 and 54 and you have no risk factors.  You are 70 years of age or older. At this age, the risks that screening can cause are greater than the benefits that it may provide. If you are at high risk for prostate cancer, your health care provider may recommend that you have screenings more often or that you start screening at a younger age. How is screening for prostate cancer done? The recommended prostate cancer screening test is a blood test called the prostate-specific antigen (PSA) test. PSA is a protein that is made in the prostate. As you age, your prostate naturally produces more PSA. Abnormally high PSA levels may be caused by:  Prostate cancer.  An enlarged prostate that is not caused by cancer  (benign prostatic hyperplasia, BPH). This condition is very common in older men.  A prostate gland infection (prostatitis). Depending on the PSA results, you may need more tests, such as:  A physical exam to check the size of your prostate gland.  Blood and imaging tests.  A procedure to remove tissue samples from your prostate gland for testing (biopsy). What are the benefits of prostate cancer screening?  Screening can help to identify cancer at an early stage, before symptoms start and when the cancer can be treated more easily.  There is a small chance that screening may lower your risk of dying from prostate cancer. The chance is small because prostate cancer is a slow-growing cancer, and most men with prostate cancer die from a different cause. What are the risks of prostate cancer screening? The main risk of prostate cancer screening is diagnosing and treating prostate cancer that would never have caused any symptoms or problems. This is called overdiagnosisand overtreatment. PSA screening cannot tell you if your PSA is high due to cancer or a different cause. A prostate biopsy is the only procedure to diagnose prostate cancer. Even the results of a biopsy may not tell you if your cancer needs to be treated. Slow-growing prostate cancer may not need any treatment other than monitoring, so diagnosing and treating it may cause unnecessary stress or other side effects. A prostate biopsy may also cause:  Infection or fever.  A false negative. This is   a result that shows that you do not have prostate cancer when you actually do have prostate cancer. Questions to ask your health care provider  When should I start prostate cancer screening?  What is my risk for prostate cancer?  How often do I need screening?  What type of screening tests do I need?  How do I get my test results?  What do my results mean?  Do I need treatment? Where to find more information  The American Cancer  Society: www.cancer.org  American Urological Association: www.auanet.org Contact a health care provider if:  You have difficulty urinating.  You have pain when you urinate or ejaculate.  You have blood in your urine or semen.  You have pain in your back or in the area of your prostate. Summary  Prostate cancer is a common type of cancer in men. The prostate gland is located below the bladder and in front of the rectum. This gland adds fluid to semen during ejaculation.  Prostate cancer screening may identify cancer at an early stage, when the cancer can be treated more easily.  The prostate-specific antigen (PSA) test is the recommended screening test for prostate cancer.  Discuss the risks and benefits of prostate cancer screening with your health care provider. If you are age 70 or older, the risks that screening can cause are greater than the benefits that it may provide. This information is not intended to replace advice given to you by your health care provider. Make sure you discuss any questions you have with your health care provider. Document Revised: 09/16/2018 Document Reviewed: 09/16/2018 Elsevier Patient Education  2020 [employer]  

## 2019-08-24 NOTE — Progress Notes (Signed)
   08/24/2019 12:59 PM   Jacob Boyer 12-14-1945 [id number]  Reason for visit: Follow up elevated PSA  HPI: I saw Jacob Boyer in urology clinic today for evaluation of elevated PSA.  He is a 74 year old African-American male with a long history of persistently rising PSA he was previously followed by Dr. Erlene Quan.  He apparently requested to see a male provider, and was put on my schedule today.  He was last seen by Dr. Erlene Quan in January 2020 when PSA was elevated at 10.5 and had been steadily increasing.  She recommended a prostate biopsy, but he did not follow-up.  He denies any weight loss or bone pain.  He does have some left leg pain that is new over the last few days but he thinks he strained a muscle.  He denies any gross hematuria.  He has mild urinary symptoms of urinary frequency.  His PSA has continued to rise since that time including 14.8 on 07/08/2019, 13.3 on 01/17/2019, and 11.9 on 05/07/2018.   The patient is adamant that he only wants to live 5 more years.  He is very opposed to a prostate biopsy or anything that is painful.  We had an extensive conversation about the AUA guidelines regarding PSA screening, and the risks and benefits of screening.  We had a very long conversation about his history of a persistently rising PSA, and my concern for prostate cancer.  He is otherwise relatively healthy for 74 aside from diabetes.  We had a very long conversation about the reasoning behind PSA screening and identifying prostate cancer when it is still in the prostate can typically be cured more than 90% of the time, whereas if prostate cancer spreads outside the prostate this is very challenging to cure and manage, and treatment comes with significant side effects like hormone therapy.  We also discussed the risks of metastatic prostate cancer including bone pain, bladder outlet obstruction, and death.  DRE today with 40 g prostate smooth without any nodules or masses.  I strongly  recommended he proceed with a prostate biopsy with his concerning PSA trend.  He is against prostate biopsy at this time, but was amenable to a prostate MRI.  He reports that if his prostate MRI is abnormal, he would consider a prostate biopsy with sedation.  Prostate MRI, follow-up in 3 to 4 weeks to discuss results  I spent 45 total minutes on the day of the encounter including pre-visit review of the medical record, face-to-face time with the patient, and post visit ordering of labs/imaging/tests.  Jacob Boyer, [facility] [address] [place], [place] [address] [phone]

## 2019-10-05 ENCOUNTER — Ambulatory Visit: Payer: Self-pay | Admitting: Urology

## 2019-10-20 ENCOUNTER — Encounter: Payer: Self-pay | Admitting: Urology

## 2019-10-20 ENCOUNTER — Ambulatory Visit: Payer: Self-pay | Admitting: Urology

## 2019-11-09 ENCOUNTER — Ambulatory Visit: Payer: Medicare PPO | Admitting: Urology

## 2019-11-14 ENCOUNTER — Other Ambulatory Visit: Payer: Self-pay

## 2019-11-14 ENCOUNTER — Ambulatory Visit
Admission: RE | Admit: 2019-11-14 | Discharge: 2019-11-14 | Disposition: A | Discharge status: Home / Self Care | Payer: Medicare PPO | Source: Ambulatory Visit | Attending: Urology | Admitting: Urology

## 2019-11-14 DIAGNOSIS — R972 Elevated prostate specific antigen [PSA]: Secondary | ICD-10-CM | POA: Diagnosis present

## 2019-11-14 MED ORDER — GADOBUTROL 1 MMOL/ML IV SOLN
9.0000 mL | Freq: Once | INTRAVENOUS | Status: AC | PRN
  Administered 2019-11-14: 9 mL via INTRAVENOUS

## 2019-11-16 ENCOUNTER — Encounter: Payer: Self-pay | Admitting: Urology

## 2019-11-16 ENCOUNTER — Ambulatory Visit: Payer: Medicare PPO | Admitting: Urology

## 2019-11-16 ENCOUNTER — Other Ambulatory Visit: Payer: Self-pay

## 2019-11-16 VITALS — BP 189/99 | HR 76 | Ht 66.0 in | Wt 208.0 lb

## 2019-11-16 DIAGNOSIS — R972 Elevated prostate specific antigen [PSA]: Secondary | ICD-10-CM | POA: Diagnosis not present

## 2019-11-16 NOTE — Progress Notes (Signed)
   11/16/2019 2:16 PM   Jacob Boyer June 13, 1945 [id number]  Reason for visit: Elevated PSA  HPI: I saw Mr. Jacob Boyer in urology clinic for discussion of elevated PSA again, as well as review of his prostate MRI results.  He was originally seen by Dr. Erlene Quan in January 2020 when PSA was elevated 10.5 and she recommended prostate biopsy, but he did not follow-up.  He then saw me in July 2021 with a persistently rising PSA, that was 14.8 in May 2021.  He was very resistant to prostate biopsy again at that visit, DRE was benign, and we ultimately decided on a prostate MRI for further restratification.  I personally reviewed the MRI dated 9/27 that shows a 21 g prostate with a 1.7 cm PI-RADS 4 lesion in the left anterior transitional zone concerning for high-grade prostate cancer.  No pelvic adenopathy or bone mets were noted on MRI.  We reviewed his MRI results concerning for prostate cancer, as well as persistently rising PSA at length.  I recommended prostate biopsy for diagnosis, and to guide further counseling and treatment options.  He again remains very resistant to prostate biopsy.  He states multiple times that he would only like to live 5 more years.  His wife has passed away.  He is not sexually active.  He would like to think more about prostate biopsy prior to proceeding.  We discussed the risk of developing metastatic disease with missing the window of opportunity to cure by waiting further, but he is adamant that he needs to think about prostate biopsy more prior to proceeding.  We discussed the risks and benefits at length, including bleeding, infection, and discomfort.  He is amenable to following up in 2 months with a repeat PSA, and reconsidering biopsy if PSA continues to rise.  Two books on prostate cancer screening and prostate cancer were given to the patient today, as well as extensive patient education materials regarding PSA screening and prostate cancer.  I think he does have a  good understanding of his elevated PSA and MRI findings that likely indicate prostate cancer.  -RTC 2 months with PSA prior -If PSA continues to rise, will again offer referral to [facility] for prostate biopsy with MRI fusion, consideration of nitrous at time of biopsy   I spent 45 total minutes on the day of the encounter including pre-visit review of the medical record, face-to-face time with the patient, and post visit ordering of labs/imaging/tests.  Billey Co, [facility] [address] [place], [place] [address] [phone]

## 2019-11-16 NOTE — Patient Instructions (Signed)
Transrectal Prostate Biopsy Patient Education and Post Procedure Instructions    -Definition A prostate biopsy is the removal of a small amount of tissue from the prostate gland. The tissue is examined to determine whether there is cancer.  -Reasons for Procedure A prostate biopsy is usually done after an abnormal finding by: Digital rectal exam Prostate specific antigen (PSA) blood test A prostate biopsy is the only way to find out if cancer cells are present.  -Possible Complications Problems from the procedure are rare, but all procedures have some risk including: Infection Bruising or lengthy bleeding from the rectum, or in urine or semen Difficulty urinating Reactions to anesthesia Factors that may increase the risk of complications include: Smoking History of bleeding disorders or easy bruising Use of any medications, over-the-counter medications, or herbal supplements Sensitivity or allergy to latex, medications, or anesthesia.  -Prior to Procedure Talk to your doctor about your medications. Blood thinning medications including aspirin should be stopped 1 week prior to procedure. If prescribed by your cardiologist we may need approval before stopping medications. Use a Fleets enema 2 hours before the procedure. Can be purchased at your pharmacy. Antibiotics will be administered in the clinic prior to procedure.  Please make sure you eat a light meal prior to coming in for your appointment. This can help prevent lightheadedness during the procedure and upset stomach from antibiotics. Please bring someone with you to the procedure to drive you home.  -Anesthesia Transrectal biopsy: Local anesthesia--Just the area that is being operated on is numbed using an injectable anesthetic.  -Description of the Procedure Transrectal biopsy--Your doctor will insert a small ultrasound device into the rectum. This device will produce sound waves to create an image of the prostate.  These images will help guide placement of the needle. Your doctor will then insert the needle through the wall of the rectum and into the prostate gland. The procedure should take approximately 15-30 minutes.  -Will It Hurt? You may have discomfort and soreness at the biopsy site. Pain and discomfort after the procedure can be managed with medications.  -Postoperative Care When you return home after the procedure, do the following to help ensure a smooth recovery: Stay hydrated. Drink plenty of fluids for the next few days. Avoid difficult physical activity the day and evening of the procedure. Keep in mind that you may see blood in your urine, stool, or semen for several days. Resume any medications that were stopped when you are advised to do so.  After the sample is taken, it will be sent to a pathologist for examination under a microscope. This doctor will analyze the sample for cancer. You will be scheduled for an appointment to discuss results. If cancer is present, your doctor will work with you to develop a treatment plan.   -Call Your Doctor or Seek Immediate Medical Attention It is important to monitor your recovery. Alert your doctor to any problems. If any of the following occur, call your doctor or go to the emergency room: Fever 100.5 or greater within 1 week post procedure go directly to ER Call the office for: Blood in the urine more than 1 week or in semen for more than 6 weeks post-biopsy Pain that you cannot control with the medications you have been given Pain, burning, urgency, or frequency of urination Cough, shortness of breath, or chest pain- if severe go to ER Heavy rectal bleeding or bleeding that lasts more than 1 week after the biopsy If you  have any questions or concerns please contact our office at ([place] [address], [place], [place] [address] [phone]   Prostate Cancer  The prostate is a  walnut-sized gland that is involved in the production of semen. It is located below a man's bladder, in front of the rectum. Prostate cancer is the abnormal growth of cells in the prostate gland. What are the causes? The exact cause of this condition is not known. What increases the risk? This condition is more likely to develop in men who:  Are older than age 75.  Are African-American.  Are obese.  Have a family history of prostate cancer.  Have a family history of breast cancer. What are the signs or symptoms? Symptoms of this condition include:  A need to urinate often.  Weak or interrupted flow of urine.  Trouble starting or stopping urination.  Inability to urinate.  Pain or burning during urination.  Painful ejaculation.  Blood in urine or semen.  Persistent pain or discomfort in the lower back, lower abdomen, hips, or upper thighs.  Trouble getting an erection.  Trouble emptying the bladder all the way. How is this diagnosed? This condition can be diagnosed with:  A digital rectal exam. For this exam, a health care provider inserts a gloved finger into the rectum to feel the prostate gland.  A blood test called a prostate-specific antigen (PSA) test.  An imaging test called transrectal ultrasonography.  A procedure in which a sample of tissue is taken from the prostate and examined under a microscope (prostate biopsy). Once the condition is diagnosed, tests will be done to determine how far the cancer has spread. This is called staging the cancer. Staging may involve imaging tests, such as:  A bone scan.  A CT scan.  A PET scan.  An MRI. The stages of prostate cancer are as follows:  Stage I. At this stage, the cancer is found in the prostate only. The cancer is not visible on imaging tests and it is usually found by accident, such as during a prostate surgery.  Stage II. At this stage, the cancer is more advanced than it is in stage I, but the  cancer has not spread outside the prostate.  Stage III. At this stage, the cancer has spread beyond the outer layer of the prostate to nearby tissues. The cancer may be found in the seminal vesicles, which are near the bladder and the prostate.  Stage IV. At this stage, the cancer has spread other parts of the body, such as the lymph nodes, bones, bladder, rectum, liver, or lungs. How is this treated? Treatment for this condition depends on several factors, including the stage of the cancer, your age, personal preferences, and your overall health. Talk with your health care provider about treatment options that are recommended for you. Common treatments include:  Observation for early stage prostate cancer (active surveillance). This involves having exams, blood tests, and in some cases, more biopsies. For some men, this is the only treatment needed.  Surgery. Types of surgeries include: ? Open surgery. In this surgery, a larger incision is made to remove the prostate. ? A laparoscopic prostatectomy. This is a surgery to remove the prostate and lymph nodes through several, small incisions. It is often referred to as a minimally invasive surgery. ? A robotic prostatectomy. This is a surgery to remove the prostate and lymph nodes with the help of a robotic arm that is controlled by  a computer. ? Orchiectomy. This is a surgery to remove the testicles. ? Cryosurgery. This is a surgery to freeze and destroy cancer cells.  Radiation treatment. Types of radiation treatment include: ? External beam radiation. This type aims beams of radiation from outside the body at the prostate to destroy cancerous cells. ? Brachytherapy. This type uses radioactive needles, seeds, wires, or tubes that are implanted into the prostate gland. Like external beam radiation, brachytherapy destroys cancerous cells. An advantage is that this type of radiation limits the damage to surrounding tissue and has fewer side  effects.  High-intensity, focused ultrasonography. This treatment destroys cancer cells by delivering high-energy ultrasound waves to the cancerous cells.  Chemotherapy medicines. This treatment kills cancer cells or stops them from multiplying.  Hormone treatment. This treatment involves taking medicines that act on one of the male hormones (testosterone): ? By stopping your body from producing testosterone. ? By blocking testosterone from reaching cancer cells. Follow these instructions at home:  Take over-the-counter and prescription medicines only as told by your health care provider.  Maintain a healthy diet.  Get plenty of sleep.  Consider joining a support group for men who have prostate cancer. Meeting with a support group may help you learn to cope with the stress of having cancer.  Keep all follow-up visits as told by your health care provider. This is important.  If you have to go to the hospital, notify your cancer specialist (oncologist).  Treatment for prostate cancer may affect sexual function. Continue to have intimate moments with your partner. This may include touching, holding, hugging, and caressing. Contact a health care provider if:  You have trouble urinating.  You have blood in your urine.  You have pain in your hips, back, or chest. Get help right away if:  You have weakness or numbness in your legs.  You cannot control urination or your bowel movements (incontinence).  You have trouble breathing.  You have sudden chest pain.  You have chills or a fever. Summary  The prostate is a walnut-sized gland that is involved in the production of semen. It is located below a man's bladder, in front of the rectum. Prostate cancer is the abnormal growth of cells in the prostate gland.  Treatment for this condition depends on several factors, including the stage of the cancer, your age, personal preferences, and your overall health. Talk with your health care  provider about treatment options that are recommended for you.  Consider joining a support group for men who have prostate cancer. Meeting with a support group may help you learn to cope with the stress of having cancer. This information is not intended to replace advice given to you by your health care provider. Make sure you discuss any questions you have with your health care provider. Document Revised: 01/16/2017 Document Reviewed: 10/15/2015 Elsevier Patient Education  2020 [employer].

## 2019-12-16 ENCOUNTER — Encounter: Payer: Self-pay | Admitting: Urology

## 2019-12-16 ENCOUNTER — Other Ambulatory Visit: Payer: Medicare PPO

## 2019-12-19 ENCOUNTER — Other Ambulatory Visit: Payer: Medicare PPO

## 2019-12-19 ENCOUNTER — Other Ambulatory Visit: Payer: Self-pay

## 2019-12-19 DIAGNOSIS — R972 Elevated prostate specific antigen [PSA]: Secondary | ICD-10-CM

## 2019-12-20 LAB — PSA: Prostate Specific Ag, Serum: 19.3 ng/mL — ABNORMAL HIGH (ref 0.0–4.0)

## 2020-01-11 ENCOUNTER — Other Ambulatory Visit: Payer: Medicare PPO

## 2020-01-18 ENCOUNTER — Ambulatory Visit (INDEPENDENT_AMBULATORY_CARE_PROVIDER_SITE_OTHER): Payer: Medicare PPO | Admitting: Urology

## 2020-01-18 ENCOUNTER — Other Ambulatory Visit: Payer: Self-pay

## 2020-01-18 ENCOUNTER — Encounter: Payer: Self-pay | Admitting: Urology

## 2020-01-18 VITALS — BP 173/94 | HR 86 | Ht 66.0 in | Wt 210.0 lb

## 2020-01-18 DIAGNOSIS — R972 Elevated prostate specific antigen [PSA]: Secondary | ICD-10-CM

## 2020-01-18 NOTE — Progress Notes (Signed)
   01/18/2020 3:12 PM   Jacob Boyer September 10, 1945 [id number]  Reason for visit: Follow up elevated PSA, abnormal MRI  HPI: I saw Mr. Cuevas in urology clinic for discussion of elevated PSA and abnormal prostate MRI results.  He was originally seen by Dr. Erlene Quan in January 2020 when PSA was elevated 10.5 and she recommended prostate biopsy, but he did not follow-up.  He then saw me in July 2021 with a persistently rising PSA, that was 14.8 in May 2021.  He was very resistant to prostate biopsy again at that visit, DRE was benign, and we ultimately decided on a prostate MRI for further restratification.  I personally reviewed the MRI dated 9/27 that shows a 21 g prostate with a 1.7 cm PI-RADS 4 lesion in the left anterior transitional zone concerning for high-grade prostate cancer.  No pelvic adenopathy or bone mets were noted on MRI.  At our last visit, we had reviewed that his significantly elevated PSA of 14.8 with PI-RADS 4 lesion on MRI was highly suspicious for prostate cancer.  He was very resistant to biopsy, and his main concern was discomfort or pain with the procedure.  We have had multiple conversations about the risks and benefits of prostate screening, the AUA guidelines that do not recommend routine screening in men over age 28, as well as his concerning rapid increase in PSA and abnormal MRI findings.  He wanted to think more about prostate biopsy, and he opted for 53-month follow-up with a repeat PSA and to rediscuss biopsy.  Extensive education materials were provided to him at the last visit.  PSA is continued to rise and is now 19.3.  I again counseled him extensively that I would recommend prostate biopsy based on his significantly rising PSA, short PSA doubling time, and abnormal MRI findings.  We reviewed that the urology group in [place] is doing the MRI fusion biopsies with nitrous as an option, and this is appealing to him.  He is amenable to prostate biopsy in  [place].  Risks of prostate biopsy include bleeding, infection (including life threatening sepsis), pain, and lower urinary symptoms. Hematuria, hematospermia, and blood in the stool are all common after biopsy and can persist up to 4 weeks.   -Referral placed to [facility] in [place] for MRI fusion prostate biopsy with nitrous -RTC with me to review pathology results  Billey Co, MD  [facility] [address] [place], [place] [address] [phone]

## 2020-01-18 NOTE — Patient Instructions (Signed)
Transrectal Ultrasound-Guided Prostate Biopsy A transrectal ultrasound-guided prostate biopsy is a procedure to remove samples of prostate tissue for testing. The procedure uses ultrasound images to guide the process of removing the samples. The samples are taken to a lab to be checked for prostate cancer. This procedure is usually done to evaluate the prostate gland of men who have raised (elevated) levels of prostate-specific antigen (PSA), which can be a sign of prostate cancer. Tell a health care provider about:  Any allergies you have.  All medicines you are taking, including vitamins, herbs, eye drops, creams, and over-the-counter medicines.  Any problems you or family members have had with anesthetic medicines.  Any blood disorders you have.  Any surgeries you have had.  Any medical conditions you have. What are the risks? Generally, this is a safe procedure. However, problems may occur, including:  Prostate infection.  Bleeding from the rectum.  Blood in the urine.  Allergic reactions to medicines.  Damage to surrounding structures such as blood vessels, organs, or muscles.  Difficulty passing urine.  Nerve damage. This is usually temporary.  Pain. What happens before the procedure? Staying hydrated Follow instructions from your health care provider about hydration, which may include:  Up to 2 hours before the procedure - you may continue to drink clear liquids, such as water, clear fruit juice, black coffee, and plain tea.  Eating and drinking restrictions Follow instructions from your health care provider about eating and drinking, which may include:  8 hours before the procedure - stop eating heavy meals or foods such as meat, fried foods, or fatty foods.  6 hours before the procedure - stop eating light meals or foods, such as toast or cereal.  6 hours before the procedure - stop drinking milk or drinks that contain milk.  2 hours before the procedure -  stop drinking clear liquids. Medicines Ask your health care provider about:  Changing or stopping your regular medicines. This is especially important if you are taking diabetes medicines or blood thinners.  Taking over-the-counter medicines, vitamins, herbs, and supplements.  Taking medicines such as aspirin and ibuprofen. These medicines can thin your blood. Do not take these medicines unless your health care provider tells you to take them. General instructions  You may be given antibiotic medicine to help prevent infection. If so, take the antibiotic as told by your health care provider.  You will be given an enema. During an enema, a liquid is injected into your rectum to clear out waste.  You may have a blood or urine sample taken.  Plan to have someone take you home from the hospital or clinic. What happens during the procedure?   To lower your risk of infection: ? Your health care team will wash or sanitize their hands. ? Hair may be removed from the surgical area. ? Your skin will be cleaned with a germ-killing soap. ? You may be given antibiotic medicine.  An IV will be inserted into one of your veins.  You will be given one or both of the following: ? A medicine to help you relax (sedative). ? A medicine to numb the area (local anesthetic).  You will be placed on your left side, and your knees will be bent toward your chest.  A probe with lubricated gel will be placed into your rectum, and images will be taken of your prostate and surrounding structures.  Numbing medicine will be injected into your prostate.  A biopsy needle will be inserted through  your rectum and guided to your prostate using the ultrasound images.  Prostate tissue samples will be removed, and the needle will then be removed.  The biopsy samples will be sent to a lab to be tested. The procedure may vary among health care providers and hospitals. What happens after the procedure?  Your blood  pressure, heart rate, breathing rate, and blood oxygen level will be monitored until the medicines you were given have worn off.  You may have some discomfort in the rectal area. You will be given pain medicine as needed.  Do not drive for 24 hours if you received a sedative. Summary  A transrectal ultrasound-guided biopsy removes samples of tissue from your prostate.  This procedure is usually done to evaluate the prostate gland of men who have raised (elevated) levels of prostate-specific antigen (PSA), which can be a sign of prostate cancer.  After your procedure, you may feel some discomfort in the rectal area.  Plan to have someone take you home from the hospital or clinic. This information is not intended to replace advice given to you by your health care provider. Make sure you discuss any questions you have with your health care provider. Document Revised: 05/26/2018 Document Reviewed: 05/02/2016 Elsevier Patient Education  [employer].

## 2020-01-31 ENCOUNTER — Other Ambulatory Visit: Payer: Self-pay | Admitting: Urology

## 2020-02-02 ENCOUNTER — Ambulatory Visit: Payer: Medicare PPO | Admitting: Urology

## 2020-02-08 ENCOUNTER — Other Ambulatory Visit: Payer: Self-pay

## 2020-02-08 ENCOUNTER — Encounter: Payer: Self-pay | Admitting: Urology

## 2020-02-08 ENCOUNTER — Ambulatory Visit: Payer: Medicare PPO | Admitting: Urology

## 2020-02-08 VITALS — BP 169/98 | HR 79 | Ht 66.0 in | Wt 210.0 lb

## 2020-02-08 DIAGNOSIS — C61 Malignant neoplasm of prostate: Secondary | ICD-10-CM | POA: Diagnosis not present

## 2020-02-08 NOTE — Progress Notes (Signed)
° °  02/08/2020 12:21 PM   [name] [birth year] [id number]  Reason for visit: Review prostate biopsy results  HPI: I saw Mr. Rachal back in urology clinic today to discuss his MRI fusion biopsy results.  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  He was ultimately amenable to prostate MRI, and a fusion guided biopsy with nitrous in [place].  MRI showed a 1.7 cm PI-RADS 4 lesion in the transition zone, but otherwise showed no evidence of lymphadenopathy or extraprostatic extension.  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  At baseline he has some mild urinary symptoms of urgency and nocturia 2-3 times per night.  We had a lengthy conversation today about the patient's new diagnosis of prostate cancer.  We reviewed the risk classifications per the AUA guidelines including very low risk, low risk, intermediate risk, and high risk disease, and the need for additional staging imaging with CT and bone scan in patients with unfavorable intermediate risk and high risk disease.  I explained that his life expectancy, clinical stage, Gleason score, PSA, and other co-morbidities influence treatment strategies.  We discussed the roles of active surveillance, radiation therapy, surgical therapy with robotic prostatectomy, and hormone therapy with androgen deprivation.  We discussed that patients urinary symptoms also impact treatment strategy, as patients with severe lower urinary tract symptoms may have significant worsening or even develop urinary retention after undergoing radiation.  In regards to surgery, we discussed robotic prostatectomy +/- lymphadenectomy at  length.  The procedure takes 3 to 4 hours, and patient's typically discharge home on post-op day #1.  A Foley catheter is left in place for 7 to 10 days to allow for healing of the vesicourethral anastomosis.  There is a small risk of bleeding, infection, damage to surrounding structures or bowel, hernia, DVT/PE, or serious cardiac or pulmonary complications.  We discussed at length post-op side effects including erectile dysfunction, and the importance of pre-operative erectile function on long-term outcomes.  Even with a nerve sparing approach, there is an approximately 25% rate of permanent erectile dysfunction.  We also discussed postop urinary incontinence at length.  We expect patients to have stress incontinence post-operatively that will improve over period of weeks to months.  Less than 10% of men will require a pad at 1 year after surgery.  Patients will need to avoid heavy lifting and strenuous activity for 3 to 4 weeks, but most men return to their baseline activity status by 6 weeks.  In summary, RONNE STEFANSKI is a 74 y.o. 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 He strongly prefers brachytherapy, as he continues to work and does not think he can take the time for external radiation treatments, or the time off after surgery.  -Bone scan to complete staging -Referral placed to Dr. Baruch Gouty with radiation oncology for consideration of brachytherapy, messaged in epic as well   Billey Co, [place] [address] [place], [place] [address] [phone]

## 2020-02-08 NOTE — Patient Instructions (Signed)
Prostate Cancer  The prostate is a male gland that helps make semen. Prostate cancer is when abnormal cells grow in this gland. Follow these instructions at home:  Take over-the-counter and prescription medicines only as told by your doctor.  Eat a healthy diet.  Get plenty of sleep.  Ask your doctor for help to find a support group for men with prostate cancer.  Keep all follow-up visits as told by your doctor. This is important.  If you have to go to the hospital, let your cancer doctor (oncologist) know.  Touch, hold, hug, and caress your partner to continue to show sexual feelings. Contact a doctor if:  You have trouble peeing (urinating).  You have blood in your pee (urine).  You have pain in your hips, back, or chest. Get help right away if:  You have weakness in your legs.  You lose feeling (have numbness) in your legs.  You cannot control your pee or your poop (stool).  You have trouble breathing.  You have sudden pain in your chest.  You have chills or a fever. Summary  The prostate is a male gland that helps make semen. Prostate cancer is when abnormal cells grow in this gland.  Ask your doctor for help to find a support group for men with prostate cancer.  Contact a doctor if you have problems peeing or have any new pain that you did not have before. This information is not intended to replace advice given to you by your health care provider. Make sure you discuss any questions you have with your health care provider. Document Revised: 01/16/2017 Document Reviewed: 10/15/2015 Elsevier Patient Education  2020 Elsevier Inc.  Brachytherapy for Prostate Cancer Brachytherapy for prostate cancer is radiation treatment that is placed inside of the prostate (prostate gland). There are several types of brachytherapy:  Low-dose rate (LDR) therapy. This may involve temporary implants or permanent radioactive seed or pellet implants. The radiation does not travel  far from the prostate, which means that healthy, noncancerous tissues around the prostate receive only a small dose of radiation. This helps to protect those tissues from injury. This type of treatment may be followed by a course of external beam radiation. ? Temporary low-dose implants are left in the prostate for 1-7 days. The implants are needles, applicators, or thin, plastic tubes (catheters) that contain radioactive material. You will need to stay in the hospital while the implant is in place. ? Permanent low-dose implants (seeds or pellets) are injected into the prostate, and they work for up to one year after they are inserted. They are left in place and are not removed.  High-dose rate (HDR) therapy. This is given through needles, applicators, or catheters that contain radioactive material. The tubes are removed after treatment, and no radiation is left in the prostate. This type of treatment may be followed by a course of external beam radiation. Tell a health care provider about:  Any allergies you have.  All medicines you are taking, including vitamins, herbs, eye drops, creams, and over-the-counter medicines.  Any problems you or family members have had with anesthetic medicines.  Any surgeries you have had.  Any blood disorders you have.  Any medical conditions you have. What are the risks? Generally, this is a safe procedure. However, problems may occur, including:  Inflammation of the rectum.  Problems getting or keeping an erection (erectile dysfunction).  Trouble urinating.  Diarrhea.  Bleeding.  Loss of bowel control. What happens before the procedure? Staying   hydrated Follow instructions from your health care provider about hydration, which may include:  Up to 2 hours before the procedure - you may continue to drink clear liquids, such as water, clear fruit juice, black coffee, and plain tea. Eating and drinking Follow instructions from your health care  provider about eating and drinking, which may include:  8 hours before the procedure - stop eating heavy meals or foods such as meat, fried foods, or fatty foods.  6 hours before the procedure - stop eating light meals or foods, such as toast or cereal.  6 hours before the procedure - stop drinking milk or drinks that contain milk.  2 hours before the procedure - stop drinking clear liquids. Medicines  Ask your health care provider about: ? Changing or stopping your regular medicines. This is especially important if you are taking diabetes medicines or blood thinners. ? Taking medicines such as aspirin and ibuprofen. These medicines can thin your blood. Do not take these medicines before your procedure if your health care provider instructs you not to.  You may be given antibiotic medicine to help prevent infection. General instructions  Plan to have someone take you home from the hospital or clinic.  If you will be going home right after the procedure, plan to have someone with you for 24 hours.  You may have imaging tests done, including an ultrasound, CT scan, or MRI.  You may have blood tests done.  You may have a test to check the electrical signals in your heart (electrocardiogram).  You may need to take medicine to clean out your bowel (bowel prep). What happens during the procedure?  To lower your risk of infection: ? Your health care team will wash or sanitize their hands. ? Your skin will be washed with soap. ? Hair may be removed from the surgical area.  An IV will be inserted into one of your veins.  You will be given one or more of the following: ? A medicine to help you relax (sedative). ? A medicine to numb the area (local anesthetic). ? A medicine to make you fall asleep (general anesthetic).  You may have a thin, plastic tube (catheter) inserted to drain your bladder.  If you are receiving brachytherapy with implants: ? A needle, applicator, or catheter  will be inserted into the prostate. It will be inserted through a body cavity, such as the rectum, or through the tissue between the testicles and the anus (perineum). ? An X-ray, ultrasound, MRI, or CT scan will be used to guide the catheter or applicator toward the prostate. ? Radioactive seeds, wires, or ribbons will be fed through the catheter or applicator. ? If the high-dose method is used:  The radioactive wires or ribbons will be left in for a few minutes and then removed.  Once the treatment is finished, the catheter or applicator will be removed. ? If the low-dose method is used, the implant will stay in place for 1-7 days.  You will remain in the hospital while the implant is in place.  Once the treatment is finished, the radioactive material and catheter will be removed.  If you are receiving permanent, low-dose brachytherapy: ? Small, radioactive seeds or pellets will be injected into your prostate. This may be done through a catheter, needle, or applicator. ? The catheter or applicator will be removed, leaving the seeds in the prostate. The procedure may vary among health care providers and hospitals. What happens after the procedure?  Your   blood pressure, heart rate, breathing rate, and blood oxygen level will be monitored until the medicines you were given have worn off.  Do not drive for 24 hours if you were given a sedative. Summary  Brachytherapy for prostate cancer is radiation treatment placed inside of the prostate (prostate gland).  There are several types of brachytherapy for prostate cancer, including low-dose temporary treatment, low-dose permanent treatment, and high-dose temporary treatment.  Temporary low-dose implants are left in the prostate for 1-7 days.  Permanent low-dose implants are injected into the prostate and left in place. They work for up to one year after they are inserted.  Permanent high-dose therapy is given through tubes that contain  radioactive material. The tubes are removed after treatment, and no radiation is left in the prostate. This information is not intended to replace advice given to you by your health care provider. Make sure you discuss any questions you have with your health care provider. Document Revised: 01/16/2017 Document Reviewed: 02/13/2016 Elsevier Patient Education  2020 Elsevier Inc.   

## 2020-02-16 ENCOUNTER — Telehealth: Payer: Self-pay | Admitting: Urology

## 2020-02-16 NOTE — Telephone Encounter (Signed)
Spoke with patient and he was in agreement with the plan. All questions answered.  Jacob Boyer

## 2020-02-16 NOTE — Telephone Encounter (Signed)
-----   Message from Sondra Come, MD sent at 02/16/2020 11:47 AM EST ----- Regarding: bone scan Good news, no prostate cancer seen on bone scan, keep follow-up with Dr. Rushie Chestnut in radiation oncology as scheduled  Legrand Rams, MD 02/16/2020   ----- Message ----- From: Kipp Brood Sent: 02/15/2020   2:37 PM EST To: Sondra Come, MD

## 2020-02-20 ENCOUNTER — Ambulatory Visit: Payer: Medicare PPO | Admitting: Radiation Oncology

## 2020-02-23 ENCOUNTER — Ambulatory Visit
Admission: RE | Admit: 2020-02-23 | Discharge: 2020-02-23 | Disposition: A | Discharge status: Home / Self Care | Payer: Medicare Other | Source: Ambulatory Visit | Attending: Radiation Oncology | Admitting: Radiation Oncology

## 2020-02-23 ENCOUNTER — Encounter: Payer: Self-pay | Admitting: Radiation Oncology

## 2020-02-23 ENCOUNTER — Encounter (INDEPENDENT_AMBULATORY_CARE_PROVIDER_SITE_OTHER): Payer: Self-pay

## 2020-02-23 VITALS — BP 162/92 | HR 79 | Temp 96.5°F | Wt 210.0 lb

## 2020-02-23 DIAGNOSIS — I10 Essential (primary) hypertension: Secondary | ICD-10-CM | POA: Diagnosis not present

## 2020-02-23 DIAGNOSIS — E119 Type 2 diabetes mellitus without complications: Secondary | ICD-10-CM | POA: Diagnosis not present

## 2020-02-23 DIAGNOSIS — Z87891 Personal history of nicotine dependence: Secondary | ICD-10-CM | POA: Insufficient documentation

## 2020-02-23 DIAGNOSIS — K2289 Other specified disease of esophagus: Secondary | ICD-10-CM | POA: Diagnosis not present

## 2020-02-23 DIAGNOSIS — C61 Malignant neoplasm of prostate: Secondary | ICD-10-CM | POA: Diagnosis present

## 2020-02-23 DIAGNOSIS — E291 Testicular hypofunction: Secondary | ICD-10-CM | POA: Diagnosis not present

## 2020-02-23 DIAGNOSIS — Z79899 Other long term (current) drug therapy: Secondary | ICD-10-CM | POA: Diagnosis not present

## 2020-02-23 DIAGNOSIS — Z794 Long term (current) use of insulin: Secondary | ICD-10-CM | POA: Diagnosis not present

## 2020-02-23 NOTE — Consult Note (Signed)
NEW PATIENT EVALUATION  Name: Jacob Boyer  MRN: [id number]  Date:   02/23/2020     DOB: April 13, 1945   This 75 y.o. 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  REFERRING PHYSICIAN: Juluis Pitch, MD  CHIEF COMPLAINT: No chief complaint on file.   DIAGNOSIS: The encounter diagnosis was Prostate cancer ([place]).   PREVIOUS INVESTIGATIONS:  MRI scan reviewed Pathology report reviewed Clinical notes reviewed  HPI: Patient is a 75 year old male who presented with a persistently rising PSA over the last 2 years most recently 19.3.  He underwent a prostate MRI 6 fusion study showing a 1.7 cm PI-RADS 4 lesion in the transition zone.  There is no evidence of extracapsular extension or lymphadenopathy.  He underwent a fusion biopsy showing 2 of 2 cores in that region positive for Gleason 7 (4+3) adenocarcinoma.  Remaining 12 cores randomly performed showed no evidence of disease.  According to the patient has had a bone scan in [place] which showed no evidence of disease.  He has some very minor lower urinary tract symptoms such as frequency and urgency.  He has had discussion about robotic prostatectomy with urology although he is favoring I-125 interstitial implant.  He is seen today for discussion.  PLANNED TREATMENT REGIMEN: I-125 interstitial implant  PAST MEDICAL HISTORY:  has a past medical history of Diabetes mellitus without complication ([place]), Esophageal motility disorder, H/O cervical spine surgery, Hypertension, Hypotestosteronism, and Seasonal allergic rhinitis.    PAST SURGICAL HISTORY:  Past Surgical History:  Procedure Laterality Date  . COLONOSCOPY WITH PROPOFOL N/A 03/06/2015   Procedure: COLONOSCOPY WITH PROPOFOL;  Surgeon: Lollie Sails, MD;  [facility] END[facility]COPY;  Service: Endoscopy;  Laterality: N/A;  . TONSILLECTOMY      FAMILY HISTORY: family  history is not on file.  SOCIAL HISTORY:  reports that he has quit smoking. His smoking use included cigarettes. He has a 2.50 pack-year smoking history. He has never used smokeless tobacco. He reports current alcohol use. He reports that he does not use drugs.  ALLERGIES: Patient has no known allergies.  MEDICATIONS:  Current Outpatient Medications  Medication Sig Dispense Refill  . fluticasone (FLONASE) 50 MCG/ACT nasal spray Place into both nostrils daily.    . insulin glargine (LANTUS) 100 UNIT/ML injection Inject 25 Units into the skin daily.    Marland Kitchen losartan-hydrochlorothiazide (HYZAAR) 100-25 MG tablet Take 1 tablet by mouth daily.    . metFORMIN (GLUCOPHAGE) 850 MG tablet Take 850 mg by mouth 3 (three) times daily.     No current facility-administered medications for this encounter.    ECOG PERFORMANCE STATUS:  0 - Asymptomatic  REVIEW OF SYSTEMS: Patient denies any weight loss, fatigue, weakness, fever, chills or night sweats. Patient denies any loss of vision, blurred vision. Patient denies any ringing  of the ears or hearing loss. No irregular heartbeat. Patient denies heart murmur or history of fainting. Patient denies any chest pain or pain radiating to her upper extremities. Patient denies any shortness of breath, difficulty breathing at night, cough or hemoptysis. Patient denies any swelling in the lower legs. Patient denies any nausea vomiting, vomiting of blood, or coffee ground material in the vomitus. Patient denies any stomach pain. Patient states has had normal bowel movements no significant constipation or diarrhea. Patient denies any dysuria, hematuria or significant nocturia. Patient denies any problems walking, swelling in the joints or loss of  balance. Patient denies any skin changes, loss of hair or loss of weight. Patient denies any excessive worrying or anxiety or significant depression. Patient denies any problems with insomnia. Patient denies excessive thirst,  polyuria, polydipsia. Patient denies any swollen glands, patient denies easy bruising or easy bleeding. Patient denies any recent infections, allergies or URI. Patient "s visual fields have not changed significantly in recent time.   PHYSICAL EXAM: BP (!) 162/92   Pulse 79   Temp (!) 96.5 F (35.8 C) (Tympanic)   Wt 210 lb (95.3 kg)   BMI 33.89 kg/m  Well-developed well-nourished patient in NAD. HEENT reveals PERLA, EOMI, discs not visualized.  Oral cavity is clear. No oral mucosal lesions are identified. Neck is clear without evidence of cervical or supraclavicular adenopathy. Lungs are clear to A&P. Cardiac examination is essentially unremarkable with regular rate and rhythm without murmur rub or thrill. Abdomen is benign with no organomegaly or masses noted. Motor sensory and DTR levels are equal and symmetric in the upper and lower extremities. Cranial nerves II through XII are grossly intact. Proprioception is intact. No peripheral adenopathy or edema is identified. No motor or sensory levels are noted. Crude visual fields are within normal range.  LABORATORY DATA: Pathology report reviewed    RADIOLOGY RESULTS: MRI scan reviewed compatible with above-stated findings   IMPRESSION: Stage IIb Gleason 7 (4+3) adenocarcinoma the prostate and 75 year old male presenting with a PSA in the 19 range  PLAN: I have run a [facility] nomogram showing only 12% chance of pelvic lymph node involvement.  I believe he would be a good candidate for I-125 interstitial implant.  I have discussed the risks and benefits of treatment including increased lower urinary tract symptoms possibility of erectile dysfunction fatigue alteration of blood counts and possible diarrhea.  Also I explained to him the risks and benefits of general anesthesia as well as radiation safety precautions.  Based on his high PSA I would like urology to start the patient on 6 months of Eligard prior to initiating his seed  implant.  We will go ahead and make arrangements for volume study as well as the actual implant.  Patient comprehends my recommendations well.  I would like to take this opportunity to thank you for allowing me to participate in the care of your patient.Carmina Miller, MD

## 2020-02-27 ENCOUNTER — Telehealth: Payer: Self-pay

## 2020-02-27 NOTE — Telephone Encounter (Signed)
-----   Message from Janan Halter, [place] sent at 02/23/2020 10:58 AM EST ----- Patient will need to be scheduled for  Eliguard injection. Thank you!

## 2020-02-27 NOTE — Telephone Encounter (Signed)
Benefits investigation faxed for Lupron/Eliagrd.

## 2020-03-02 NOTE — Telephone Encounter (Signed)
Called pt's insurance company to initiate PA, spoke with Baldo Ash, [place] who states that PA cannot be completed until pt staging has been completed. Currently pt scheduled for bone scan for staging. Will attempt when bone scan is complete.

## 2020-03-19 ENCOUNTER — Other Ambulatory Visit
Admission: RE | Admit: 2020-03-19 | Discharge: 2020-03-19 | Disposition: A | Discharge status: Home / Self Care | Payer: Medicare Other | Source: Ambulatory Visit | Attending: Radiation Oncology | Admitting: Radiation Oncology

## 2020-03-19 ENCOUNTER — Other Ambulatory Visit: Payer: Self-pay

## 2020-03-19 DIAGNOSIS — Z01812 Encounter for preprocedural laboratory examination: Secondary | ICD-10-CM | POA: Insufficient documentation

## 2020-03-19 DIAGNOSIS — C61 Malignant neoplasm of prostate: Secondary | ICD-10-CM | POA: Insufficient documentation

## 2020-03-19 DIAGNOSIS — Z20822 Contact with and (suspected) exposure to covid-19: Secondary | ICD-10-CM | POA: Insufficient documentation

## 2020-03-19 LAB — SARS CORONAVIRUS 2 (TAT 6-24 HRS): SARS Coronavirus 2: NEGATIVE

## 2020-03-20 ENCOUNTER — Encounter: Payer: Self-pay | Admitting: Anesthesiology

## 2020-03-21 ENCOUNTER — Ambulatory Visit: Admission: RE | Admit: 2020-03-21 | Payer: Medicare Other | Source: Home / Self Care

## 2020-03-21 ENCOUNTER — Ambulatory Visit
Admission: RE | Admit: 2020-03-21 | Discharge: 2020-03-21 | Disposition: A | Discharge status: Home / Self Care | Payer: Medicare Other | Source: Ambulatory Visit | Attending: Radiation Oncology | Admitting: Radiation Oncology

## 2020-03-21 VITALS — BP 159/95 | HR 79 | Temp 95.0°F | Wt 212.0 lb

## 2020-03-21 DIAGNOSIS — C61 Malignant neoplasm of prostate: Secondary | ICD-10-CM

## 2020-03-21 SURGERY — ULTRASOUND, PROSTATE, FOR VOLUME DETERMINATION
Anesthesia: Choice

## 2020-03-28 ENCOUNTER — Ambulatory Visit (INDEPENDENT_AMBULATORY_CARE_PROVIDER_SITE_OTHER): Payer: Medicare Other | Admitting: Urology

## 2020-03-28 ENCOUNTER — Other Ambulatory Visit: Payer: Self-pay

## 2020-03-28 ENCOUNTER — Ambulatory Visit
Admission: RE | Admit: 2020-03-28 | Discharge: 2020-03-28 | Disposition: A | Discharge status: Home / Self Care | Payer: Medicare Other | Source: Ambulatory Visit | Attending: Radiation Oncology | Admitting: Radiation Oncology

## 2020-03-28 ENCOUNTER — Other Ambulatory Visit: Payer: Self-pay | Admitting: Radiology

## 2020-03-28 ENCOUNTER — Other Ambulatory Visit: Payer: Self-pay | Admitting: Urology

## 2020-03-28 ENCOUNTER — Encounter: Payer: Self-pay | Admitting: Urology

## 2020-03-28 VITALS — BP 150/84 | HR 80 | Ht 66.0 in | Wt 210.0 lb

## 2020-03-28 DIAGNOSIS — C61 Malignant neoplasm of prostate: Secondary | ICD-10-CM | POA: Insufficient documentation

## 2020-03-28 DIAGNOSIS — I10 Essential (primary) hypertension: Secondary | ICD-10-CM | POA: Insufficient documentation

## 2020-03-28 DIAGNOSIS — E349 Endocrine disorder, unspecified: Secondary | ICD-10-CM | POA: Insufficient documentation

## 2020-03-28 DIAGNOSIS — K224 Dyskinesia of esophagus: Secondary | ICD-10-CM | POA: Insufficient documentation

## 2020-03-28 DIAGNOSIS — J302 Other seasonal allergic rhinitis: Secondary | ICD-10-CM | POA: Insufficient documentation

## 2020-03-28 NOTE — Patient Instructions (Signed)
Please call [phone] to schedule bone scan.

## 2020-03-28 NOTE — Progress Notes (Signed)
   03/28/2020 2:30 PM   Jacob Boyer [birth year] [id number]  Reason for visit: Follow up prostate cancer  HPI: Briefly, 75 year old male with a long history of persistently rising PSA with a peak of 19.3 in November 2021. He ultimately underwent a prostate MRI that showed a one-point centimeter PI-RADS four lesion in the transition zone, but no evidence of lymphadenopathy or extraprostatic extension. Prostate volume was only 21 g. 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  A bone scan was ordered for staging, but has not yet been completed. At baseline he has some mild urinary symptoms of urgency and nocturia 2-3 times per night.  He saw Dr. Baruch Gouty on 02/23/2020 who recommended brachytherapy seed implant, and consideration of 6 months of ADT. The patient is very averse to 6 months of ADT secondary to side effect profile, and with his very small prostate, I think this is not unreasonable to defer. I contacted Dr. Baruch Gouty with the patient's concerns regarding ADT.  We discussed the need for bone scan to rule out any metastatic disease prior to undergoing definitive treatment with brachytherapy, and he understands this. We'll call to schedule.  Call with bone scan results Plan for brachytherapy seed implant on 04/16/2020  Billey Co, MD  [place] [address], [place], [place] [address] [phone]

## 2020-04-09 ENCOUNTER — Encounter
Admission: RE | Admit: 2020-04-09 | Discharge: 2020-04-09 | Disposition: A | Discharge status: Home / Self Care | Payer: Medicare Other | Source: Ambulatory Visit | Attending: Urology | Admitting: Urology

## 2020-04-09 ENCOUNTER — Other Ambulatory Visit: Payer: Self-pay

## 2020-04-09 HISTORY — DX: Malignant (primary) neoplasm, unspecified: C80.1

## 2020-04-09 HISTORY — DX: Sleep apnea, unspecified: G47.30

## 2020-04-09 HISTORY — DX: Anxiety disorder, unspecified: F41.9

## 2020-04-09 NOTE — Patient Instructions (Signed)
Your procedure is scheduled on:04-16-20 MONDAY Report to the Registration Desk on the 1st floor of the [facility]-Then proceed to the 2nd floor Surgery Desk in the [place] To find out your arrival time, please call [phone] between 1PM - 3PM on: 04-13-20 FRIDAY  REMEMBER: Instructions that are not followed completely may result in serious medical risk, up to and including death; or upon the discretion of your surgeon and anesthesiologist your surgery may need to be rescheduled.  Do not eat food after midnight the night before surgery.  No gum chewing, lozengers or hard candies.  You may however, drink WATER up to 2 hours before you are scheduled to arrive for your surgery. Do not drink anything within 2 hours of your scheduled arrival time.  Type 1 and Type 2 diabetics should only drink water.  TAKE THESE MEDICATIONS THE MORNING OF SURGERY WITH A SIP OF WATER: -NONE  Stop Metformin 2 days prior to surgery-LAST DOSE ON 04-13-20 FRIDAY  DO NOT TAKE ANY INSULIN THE MORNING OF YOUR SURGERY  One week prior to surgery: Stop Anti-inflammatories (NSAIDS) such as Advil, Aleve, Ibuprofen, Motrin, Naproxen, Naprosyn and Aspirin based products such as Excedrin, Goodys Powder, BC Powder-OK TO TAKE TYLENOL IF NEEDED  Stop ANY OVER THE COUNTER supplements until after surgery. (However, you may continue taking Vitamin D up until the day before surgery.)  No Alcohol for 24 hours before or after surgery.  No Smoking including e-cigarettes for 24 hours prior to surgery.  No chewable tobacco products for at least 6 hours prior to surgery.  No nicotine patches on the day of surgery.  Do not use any "recreational" drugs for at least a week prior to your surgery.  Please be advised that the combination of cocaine and anesthesia may have negative outcomes, up to and including death. If you test positive for cocaine, your surgery will be cancelled.  On the morning of surgery brush your teeth  with toothpaste and water, you may rinse your mouth with mouthwash if you wish. Do not swallow any toothpaste or mouthwash.  Do not wear jewelry, make-up, hairpins, clips or nail polish.  Do not wear lotions, powders, or perfumes.   Do not shave body from the neck down 48 hours prior to surgery just in case you cut yourself which could leave a site for infection.  Also, freshly shaved skin may become irritated if using the CHG soap.  Contact lenses, hearing aids and dentures may not be worn into surgery.  Do not bring valuables to the hospital. [facility] is not responsible for any missing/lost belongings or valuables.   Fleets enema as directed-DO FLEET ENEMA AT [place] (SUNDAY NIGHT 04-15-20)  Notify your doctor if there is any change in your medical condition (cold, fever, infection).  Wear comfortable clothing (specific to your surgery type) to the hospital.  Plan for stool softeners for home use; pain medications have a tendency to cause constipation. You can also help prevent constipation by eating foods high in fiber such as fruits and vegetables and drinking plenty of fluids as your diet allows.  After surgery, you can help prevent lung complications by doing breathing exercises.  Take deep breaths and cough every 1-2 hours. Your doctor may order a device called an Incentive Spirometer to help you take deep breaths. When coughing or sneezing, hold a pillow firmly against your incision with both hands. This is called "splinting." Doing this helps protect your incision. It  also decreases belly discomfort.  If you are being admitted to the hospital overnight, leave your suitcase in the car. After surgery it may be brought to your room.  If you are being discharged the day of surgery, you will not be allowed to drive home. You will need a responsible adult (18 years or older) to drive you home and stay with you that night.   If you are taking public  transportation, you will need to have a responsible adult (18 years or older) with you. Please confirm with your physician that it is acceptable to use public transportation.   Please call the [facility]. at [phone] if you have any questions about these instructions.  Visitation Policy:  Patients undergoing a surgery or procedure may have one family member or support person with them as long as that person is not COVID-19 positive or experiencing its symptoms.  That person may remain in the waiting area during the procedure.  Inpatient Visitation:    Visiting hours are 7 a.m. to 8 p.m. Patients will be allowed one visitor. The visitor may change daily. The visitor must pass COVID-19 screenings, use hand sanitizer when entering and exiting the patient's room and wear a mask at all times, including in the patient's room. Patients must also wear a mask when staff or their visitor are in the room. Masking is required regardless of vaccination status. Systemwide, no visitors 17 or younger.

## 2020-04-09 NOTE — Progress Notes (Signed)
  Perioperative Services Pre-Admission/Anesthesia Testing   Date: 04/09/20 Name: FREDRIC SLABACH MRN:   [id number]  Re: Consideration of preoperative prophylactic antibiotic change   Request sent to: Billey Co, MD (routed and/or faxed via [facility])  Planned Surgical Procedure(s):    Case: [id number] Date/Time: 04/16/20 0715   Procedure: RADIOACTIVE SEED IMPLANT/BRACHYTHERAPY IMPLANT (N/A )   Anesthesia type: Choice   Pre-op diagnosis: Prostate cancer   Location: [place] 10 / [place] ORS FOR ANESTHESIA GROUP   Surgeons: Billey Co, MD    Notes: 1. Patient has NO documented allergy to PCN   Request:  As an evidence based approach to reducing the rate of incidence for post-operative SSI and the development of MDROs, could an agent with narrower coverage for preoperative prophylaxis in this patient's upcoming surgical course be considered?   1. Currently ordered preoperative prophylactic ABX: ciprofloxacin.   2. Specifically requesting change to cephalosporin (CEFAZOLIN).   3. Please communicate decision with me and I will change the orders in Epic as per your direction.   Honor Loh, MSN, APRN, FNP-C, CEN [facility]  Peri-operative Services Nurse Practitioner FAX: [phone] 04/09/20 12:31 PM

## 2020-04-10 ENCOUNTER — Encounter
Admission: RE | Admit: 2020-04-10 | Discharge: 2020-04-10 | Disposition: A | Discharge status: Home / Self Care | Payer: Medicare Other | Source: Ambulatory Visit | Attending: Urology | Admitting: Urology

## 2020-04-10 DIAGNOSIS — E119 Type 2 diabetes mellitus without complications: Secondary | ICD-10-CM | POA: Diagnosis not present

## 2020-04-10 DIAGNOSIS — Z01810 Encounter for preprocedural cardiovascular examination: Secondary | ICD-10-CM | POA: Diagnosis not present

## 2020-04-10 DIAGNOSIS — I10 Essential (primary) hypertension: Secondary | ICD-10-CM | POA: Diagnosis not present

## 2020-04-10 DIAGNOSIS — Z01818 Encounter for other preprocedural examination: Secondary | ICD-10-CM | POA: Diagnosis not present

## 2020-04-10 LAB — CBC
HCT: 36.6 % — ABNORMAL LOW (ref 39.0–52.0)
Hemoglobin: 12.5 g/dL — ABNORMAL LOW (ref 13.0–17.0)
MCH: 28.9 pg (ref 26.0–34.0)
MCHC: 34.2 g/dL (ref 30.0–36.0)
MCV: 84.5 fL (ref 80.0–100.0)
Platelets: 279 10*3/uL (ref 150–400)
RBC: 4.33 MIL/uL (ref 4.22–5.81)
RDW: 14.4 % (ref 11.5–15.5)
WBC: 6.5 10*3/uL (ref 4.0–10.5)
nRBC: 0 % (ref 0.0–0.2)

## 2020-04-12 ENCOUNTER — Other Ambulatory Visit: Payer: Self-pay | Admitting: Urology

## 2020-04-12 ENCOUNTER — Other Ambulatory Visit
Admission: RE | Admit: 2020-04-12 | Discharge: 2020-04-12 | Disposition: A | Discharge status: Home / Self Care | Payer: Medicare Other | Source: Ambulatory Visit | Attending: Urology | Admitting: Urology

## 2020-04-12 ENCOUNTER — Other Ambulatory Visit: Payer: Self-pay

## 2020-04-12 DIAGNOSIS — Z01812 Encounter for preprocedural laboratory examination: Secondary | ICD-10-CM | POA: Insufficient documentation

## 2020-04-12 DIAGNOSIS — Z20822 Contact with and (suspected) exposure to covid-19: Secondary | ICD-10-CM | POA: Diagnosis not present

## 2020-04-13 LAB — SARS CORONAVIRUS 2 (TAT 6-24 HRS): SARS Coronavirus 2: NEGATIVE

## 2020-04-16 ENCOUNTER — Encounter
Admission: RE | Disposition: A | Discharge status: Home / Self Care | Payer: Self-pay | Source: Home / Self Care | Attending: Urology

## 2020-04-16 ENCOUNTER — Ambulatory Visit
Admission: RE | Admit: 2020-04-16 | Discharge: 2020-04-16 | Disposition: A | Discharge status: Home / Self Care | Payer: Medicare Other | Attending: Urology | Admitting: Urology

## 2020-04-16 ENCOUNTER — Ambulatory Visit: Payer: Medicare Other

## 2020-04-16 ENCOUNTER — Encounter: Payer: Self-pay | Admitting: Urology

## 2020-04-16 ENCOUNTER — Other Ambulatory Visit: Payer: Self-pay

## 2020-04-16 DIAGNOSIS — Z87891 Personal history of nicotine dependence: Secondary | ICD-10-CM | POA: Diagnosis not present

## 2020-04-16 DIAGNOSIS — Z794 Long term (current) use of insulin: Secondary | ICD-10-CM | POA: Diagnosis not present

## 2020-04-16 DIAGNOSIS — C61 Malignant neoplasm of prostate: Secondary | ICD-10-CM

## 2020-04-16 DIAGNOSIS — Z79899 Other long term (current) drug therapy: Secondary | ICD-10-CM | POA: Diagnosis not present

## 2020-04-16 HISTORY — PX: RADIOACTIVE SEED IMPLANT: SHX5150

## 2020-04-16 LAB — GLUCOSE, CAPILLARY
Glucose-Capillary: 163 mg/dL — ABNORMAL HIGH (ref 70–99)
Glucose-Capillary: 188 mg/dL — ABNORMAL HIGH (ref 70–99)

## 2020-04-16 SURGERY — INSERTION, RADIATION SOURCE, PROSTATE
Anesthesia: General

## 2020-04-16 MED ORDER — OXYCODONE HCL 5 MG/5ML PO SOLN: 5.0000 mg | Freq: Once | ORAL | Status: AC | PRN

## 2020-04-16 MED ORDER — TAMSULOSIN HCL 0.4 MG PO CAPS: 0.4000 mg | ORAL_CAPSULE | Freq: Every day | ORAL | 0 refills | Status: DC

## 2020-04-16 MED ORDER — FAMOTIDINE 20 MG PO TABS
ORAL_TABLET | ORAL | Status: AC
  Administered 2020-04-16: 20 mg
  Filled 2020-04-16: qty 1

## 2020-04-16 MED ORDER — MEPERIDINE HCL 50 MG/ML IJ SOLN
6.2500 mg | INTRAMUSCULAR | Status: DC | PRN
Start: 2020-04-16 — End: 2020-04-16

## 2020-04-16 MED ORDER — FENTANYL CITRATE (PF) 100 MCG/2ML IJ SOLN
25.0000 ug | INTRAMUSCULAR | Status: DC | PRN
  Administered 2020-04-16: 25 ug via INTRAVENOUS

## 2020-04-16 MED ORDER — OXYCODONE HCL 5 MG PO TABS
ORAL_TABLET | ORAL | Status: AC
  Filled 2020-04-16: qty 1

## 2020-04-16 MED ORDER — BACITRACIN ZINC 500 UNIT/GM EX OINT
TOPICAL_OINTMENT | CUTANEOUS | Status: AC
  Filled 2020-04-16: qty 28.35

## 2020-04-16 MED ORDER — DEXAMETHASONE SODIUM PHOSPHATE 10 MG/ML IJ SOLN
INTRAMUSCULAR | Status: AC
  Filled 2020-04-16: qty 1

## 2020-04-16 MED ORDER — BUPIVACAINE HCL (PF) 0.5 % IJ SOLN
INTRAMUSCULAR | Status: AC
  Filled 2020-04-16: qty 10

## 2020-04-16 MED ORDER — ONDANSETRON HCL 4 MG/2ML IJ SOLN
INTRAMUSCULAR | Status: AC
  Filled 2020-04-16: qty 2

## 2020-04-16 MED ORDER — MIDAZOLAM HCL 2 MG/2ML IJ SOLN
INTRAMUSCULAR | Status: DC | PRN
  Administered 2020-04-16: 1 mg via INTRAVENOUS

## 2020-04-16 MED ORDER — CIPROFLOXACIN IN D5W 400 MG/200ML IV SOLN
INTRAVENOUS | Status: AC
  Filled 2020-04-16: qty 200

## 2020-04-16 MED ORDER — MIDAZOLAM HCL 2 MG/2ML IJ SOLN
INTRAMUSCULAR | Status: AC
  Filled 2020-04-16: qty 2

## 2020-04-16 MED ORDER — FLEET ENEMA 7-19 GM/118ML RE ENEM: 1.0000 | ENEMA | Freq: Once | RECTAL | Status: DC

## 2020-04-16 MED ORDER — CIPROFLOXACIN HCL 500 MG PO TABS: 500.0000 mg | ORAL_TABLET | Freq: Two times a day (BID) | ORAL | 0 refills | Status: AC

## 2020-04-16 MED ORDER — FENTANYL CITRATE (PF) 100 MCG/2ML IJ SOLN
INTRAMUSCULAR | Status: AC
  Filled 2020-04-16: qty 2

## 2020-04-16 MED ORDER — ROCURONIUM BROMIDE 100 MG/10ML IV SOLN
INTRAVENOUS | Status: DC | PRN
  Administered 2020-04-16: 50 mg via INTRAVENOUS

## 2020-04-16 MED ORDER — DEXAMETHASONE SODIUM PHOSPHATE 10 MG/ML IJ SOLN
INTRAMUSCULAR | Status: DC | PRN
  Administered 2020-04-16: 5 mg via INTRAVENOUS

## 2020-04-16 MED ORDER — FENTANYL CITRATE (PF) 100 MCG/2ML IJ SOLN
INTRAMUSCULAR | Status: DC | PRN
  Administered 2020-04-16: 25 ug via INTRAVENOUS
  Administered 2020-04-16: 50 ug via INTRAVENOUS

## 2020-04-16 MED ORDER — SUGAMMADEX SODIUM 200 MG/2ML IV SOLN
INTRAVENOUS | Status: DC | PRN
  Administered 2020-04-16: 400 mg via INTRAVENOUS

## 2020-04-16 MED ORDER — CHLORHEXIDINE GLUCONATE 0.12 % MT SOLN: 15.0000 mL | Freq: Once | OROMUCOSAL | Status: DC

## 2020-04-16 MED ORDER — PROPOFOL 10 MG/ML IV BOLUS
INTRAVENOUS | Status: DC | PRN
  Administered 2020-04-16: 140 mg via INTRAVENOUS

## 2020-04-16 MED ORDER — SODIUM CHLORIDE 0.9 % IV SOLN: INTRAVENOUS | Status: DC

## 2020-04-16 MED ORDER — PROMETHAZINE HCL 25 MG/ML IJ SOLN: 6.2500 mg | INTRAMUSCULAR | Status: DC | PRN

## 2020-04-16 MED ORDER — ONDANSETRON HCL 4 MG/2ML IJ SOLN
INTRAMUSCULAR | Status: DC | PRN
  Administered 2020-04-16: 4 mg via INTRAVENOUS

## 2020-04-16 MED ORDER — ORAL CARE MOUTH RINSE: 15.0000 mL | Freq: Once | OROMUCOSAL | Status: DC

## 2020-04-16 MED ORDER — CIPROFLOXACIN HCL 500 MG PO TABS: 500.0000 mg | ORAL_TABLET | Freq: Two times a day (BID) | ORAL | 0 refills | Status: DC

## 2020-04-16 MED ORDER — OXYCODONE HCL 5 MG PO TABS
5.0000 mg | ORAL_TABLET | Freq: Once | ORAL | Status: AC | PRN
  Administered 2020-04-16: 5 mg via ORAL

## 2020-04-16 MED ORDER — FAMOTIDINE 20 MG PO TABS: 20.0000 mg | ORAL_TABLET | Freq: Once | ORAL | Status: DC

## 2020-04-16 MED ORDER — CIPROFLOXACIN IN D5W 400 MG/200ML IV SOLN
400.0000 mg | INTRAVENOUS | Status: AC
  Administered 2020-04-16: 400 mg via INTRAVENOUS

## 2020-04-16 MED ORDER — HYDROCODONE-ACETAMINOPHEN 5-325 MG PO TABS: 1.0000 | ORAL_TABLET | Freq: Four times a day (QID) | ORAL | 0 refills | Status: AC | PRN

## 2020-04-16 MED ORDER — PROPOFOL 10 MG/ML IV BOLUS
INTRAVENOUS | Status: AC
  Filled 2020-04-16: qty 40

## 2020-04-16 MED ORDER — BACITRACIN 500 UNIT/GM EX OINT
TOPICAL_OINTMENT | CUTANEOUS | Status: DC | PRN
  Administered 2020-04-16: 1 via TOPICAL

## 2020-04-16 MED ORDER — HYDROCODONE-ACETAMINOPHEN 5-325 MG PO TABS
1.0000 | ORAL_TABLET | Freq: Four times a day (QID) | ORAL | 0 refills | Status: AC | PRN
Start: 2020-04-16 — End: 2020-04-19

## 2020-04-16 MED ORDER — LIDOCAINE HCL (CARDIAC) PF 100 MG/5ML IV SOSY
PREFILLED_SYRINGE | INTRAVENOUS | Status: DC | PRN
  Administered 2020-04-16: 100 mg via INTRAVENOUS

## 2020-04-16 MED ORDER — FENTANYL CITRATE (PF) 100 MCG/2ML IJ SOLN
INTRAMUSCULAR | Status: AC
  Administered 2020-04-16: 25 ug via INTRAVENOUS
  Filled 2020-04-16: qty 2

## 2020-04-16 SURGICAL SUPPLY — 26 items
BAG DRN RND TRDRP ANRFLXCHMBR (UROLOGICAL SUPPLIES) ×1
BAG URINE DRAIN 2000ML AR STRL (UROLOGICAL SUPPLIES) ×2 IMPLANT
BLADE CLIPPER SURG (BLADE) IMPLANT
BRUSH SCRUB EZ 1% IODOPHOR (MISCELLANEOUS) ×2 IMPLANT
CATH FOL 2WAY LX 16X5 (CATHETERS) ×2 IMPLANT
COVER BACK TABLE REUSABLE LG (DRAPES) ×2 IMPLANT
DRAPE INCISE 23X17 IOBAN STRL (DRAPES) ×1
DRAPE INCISE IOBAN 23X17 STRL (DRAPES) ×1 IMPLANT
DRAPE UNDER BUTTOCK W/FLU (DRAPES) ×2 IMPLANT
DRSG TELFA 3X8 NADH (GAUZE/BANDAGES/DRESSINGS) ×2 IMPLANT
GLOVE BIOGEL PI IND STRL 7.5 (GLOVE) ×2 IMPLANT
GLOVE BIOGEL PI INDICATOR 7.5 (GLOVE) ×2
GLOVE SURG ENC MOIS LTX SZ7.5 (GLOVE) ×4 IMPLANT
GOWN STRL REUS W/ TWL LRG LVL3 (GOWN DISPOSABLE) ×1 IMPLANT
GOWN STRL REUS W/ TWL XL LVL3 (GOWN DISPOSABLE) ×2 IMPLANT
GOWN STRL REUS W/TWL LRG LVL3 (GOWN DISPOSABLE) ×2
GOWN STRL REUS W/TWL XL LVL3 (GOWN DISPOSABLE) ×4
IV NS 1000ML (IV SOLUTION) ×2
IV NS 1000ML BAXH (IV SOLUTION) ×1 IMPLANT
KIT TURNOVER CYSTO (KITS) ×2 IMPLANT
MANIFOLD NEPTUNE II (INSTRUMENTS) IMPLANT
PACK CYSTO AR (MISCELLANEOUS) ×2 IMPLANT
SET CYSTO W/LG BORE CLAMP LF (SET/KITS/TRAYS/PACK) ×2 IMPLANT
SURGILUBE 2OZ TUBE FLIPTOP (MISCELLANEOUS) ×2 IMPLANT
SYR 10ML LL (SYRINGE) ×2 IMPLANT
WATER STERILE IRR 1000ML POUR (IV SOLUTION) ×2 IMPLANT

## 2020-04-16 NOTE — Op Note (Signed)
Preoperative diagnosis: Adenocarcinoma of the prostate    Postoperative diagnosis: Same    Procedure: I-125 prostate seed implantation, cystoscopy   Surgeon: Nickolas Madrid, MD   Radiation Oncologist: Noreene Filbert, M.D.    Anesthesia: General   Drains: none   Complications: none   Indications: Prostate cancer   Procedure: The patient was brought to operating suite and placement table in the supine position. At this time, a universal timeout protocol was performed, all team members were identified, Venodyne boots are placed, and he was administered IV Ancef in the preoperative period. He was placed in lithotomy position and prepped and draped in usual fashion. The radiation oncology department placed a transrectal ultrasound probe anchoring stand/ grid and aligned with previous imaging from the volume study. Foley catheter was inserted without difficulty.  All needle passage was done with real-time transrectal ultrasound guidance in both the transverse and sagittal plains in order to achieve the desired preplanned position. A total of 20 needles were placed.  34 active seeds were implanted. The Foley catheter was removed and a rigid cystoscopy failed to show any seeds outside the prostate without evidence of trauma to the urethral, prostatic fossa, or bladder.   Cystoscopy showed a normal urethra, small prostate and no median lobe, and bladder was grossly normal.  The bladder was drained.  A fluoroscopic image was then obtained showing excellent distrubution of the brachytherapy seeds.  Each seed was counted and counts were correct.     The patient was then repositioned in the supine position, reversed from anesthesia, and taken to the PACU in stable condition.  Nickolas Madrid, MD 04/16/2020

## 2020-04-16 NOTE — Anesthesia Procedure Notes (Signed)
Procedure Name: Intubation Date/Time: 04/16/2020 7:50 AM Performed by: Aline Brochure, CRNA Pre-anesthesia Checklist: Patient identified, Emergency Drugs available, Suction available and Patient being monitored Patient Re-evaluated:Patient Re-evaluated prior to induction Oxygen Delivery Method: Circle system utilized Preoxygenation: Pre-oxygenation with 100% oxygen Induction Type: IV induction Ventilation: Mask ventilation without difficulty Laryngoscope Size: McGraph and 4 Grade View: Grade I Tube type: Oral Tube size: 7.0 mm Number of attempts: 1 Airway Equipment and Method: Stylet and Oral airway Placement Confirmation: ETT inserted through vocal cords under direct vision,  positive ETCO2 and breath sounds checked- equal and bilateral Secured at: 21 cm Tube secured with: Tape Dental Injury: Teeth and Oropharynx as per pre-operative assessment

## 2020-04-16 NOTE — H&P (Signed)
   04/16/20 7:13 AM   Jacob Boyer [birth year] [id number]   HPI: 75 year old male with a long history of persistently rising PSA with a peak of 19.3 in November 2021. He ultimately underwent a prostate MRI that showed a PI-RADS four lesion in the transition zone, but no evidence of lymphadenopathy or extraprostatic extension. Prostate volume was only 21 g. He underwent a fusion biopsy with nitrous in [place] that showed benign tissue in the standard 12 core template, but 2/2 cores in the region of interest showed Gleason score 4+3=7 prostateadenocarcinoma,with 90% max core involvement, and 80% pattern 4 disease.  Staging imaging showed no evidence of metastatic disease, and he opted for brachytherapy.   PMH: Past Medical History:  Diagnosis Date  . Anxiety   . Cancer ([place])   . Diabetes mellitus without complication ([place])   . Esophageal motility disorder   . H/O cervical spine surgery   . Hypertension   . Hypotestosteronism   . Seasonal allergic rhinitis   . Sleep apnea    HAS NOT USED CPAP IN OVER 20 YEARS    Surgical History: Past Surgical History:  Procedure Laterality Date  . COLONOSCOPY WITH PROPOFOL N/A 03/06/2015   Procedure: COLONOSCOPY WITH PROPOFOL;  Surgeon: Lollie Sails, MD;  Location: [facility] ENDOSCOPY;  Service: Endoscopy;  Laterality: N/A;  . MOUTH SURGERY    . TONSILLECTOMY     AND ADENOIDS    Family History: Family History  Problem Relation Age of Onset  . Prostate cancer Neg Hx   . Kidney cancer Neg Hx   . Bladder Cancer Neg Hx     Social History:  reports that he quit smoking about 25 years ago. His smoking use included cigarettes. He has a 2.50 pack-year smoking history. He has never used smokeless tobacco. He reports current alcohol use. He reports that he does not use drugs.  Physical Exam: BP (!) 163/92   Pulse 76   Temp 97.9 F (36.6 C) (Temporal)   Resp 20   Wt 95.3 kg   SpO2 97%   BMI 33.89 kg/m    Constitutional:  Alert and  oriented, No acute distress. Cardiovascular: Regular rate and rhythm Respiratory: Clear to auscultation bilaterally GI: Abdomen is soft, nontender, nondistended, no abdominal masses  Assessment & Plan:   75 year old male with unfavorable intermediate risk prostate cancer and no evidence of metastatic disease here today for brachytherapy implant.    Nickolas Madrid, MD 04/16/2020  [facility] [address] [place], [place] [address] [phone]

## 2020-04-16 NOTE — Transfer of Care (Signed)
Immediate Anesthesia Transfer of Care Note  Patient: Jacob Boyer  Procedure(s) Performed: RADIOACTIVE SEED IMPLANT/BRACHYTHERAPY IMPLANT (N/A )  Patient Location: PACU  Anesthesia Type:General  Level of Consciousness: awake, alert  and oriented  Airway & Oxygen Therapy: Patient connected to face mask oxygen  Post-op Assessment: Post -op Vital signs reviewed and stable  Post vital signs: stable  Last Vitals:  Vitals Value Taken Time  BP 153/94 04/16/20 0845  Temp    Pulse 83 04/16/20 0846  Resp 16 04/16/20 0846  SpO2 100 % 04/16/20 0846  Vitals shown include unvalidated device data.  Last Pain:  Vitals:   04/16/20 0623  TempSrc: Temporal  PainSc: 0-No pain         Complications: No complications documented.

## 2020-04-16 NOTE — Anesthesia Preprocedure Evaluation (Signed)
Anesthesia Evaluation  Patient identified by MRN, date of birth, ID band Patient awake    Reviewed: Allergy & Precautions, NPO status , Patient's Chart, lab work & pertinent test results  History of Anesthesia Complications Negative for: history of anesthetic complications  Airway Mallampati: II  TM Distance: >3 FB Neck ROM: Full    Dental  (+) Chipped   Pulmonary neg sleep apnea, neg COPD, former smoker,    breath sounds clear to auscultation- rhonchi (-) wheezing      Cardiovascular Exercise Tolerance: Good hypertension, Pt. on medications (-) CAD, (-) Past MI, (-) Cardiac Stents and (-) CABG  Rhythm:Regular Rate:Normal - Systolic murmurs and - Diastolic murmurs    Neuro/Psych neg Seizures Anxiety negative neurological ROS     GI/Hepatic negative GI ROS, Neg liver ROS,   Endo/Other  diabetes, Insulin Dependent  Renal/GU negative Renal ROS     Musculoskeletal negative musculoskeletal ROS (+)   Abdominal (+) + obese,   Peds  Hematology negative hematology ROS (+)   Anesthesia Other Findings Past Medical History: No date: Anxiety No date: Cancer ([place]) No date: Diabetes mellitus without complication ([facility]) No date: Esophageal motility disorder No date: H/O cervical spine surgery No date: Hypertension No date: Hypotestosteronism No date: Seasonal allergic rhinitis No date: Sleep apnea     Comment:  HAS NOT USED CPAP IN OVER 20 YEARS   Reproductive/Obstetrics                             Anesthesia Physical Anesthesia Plan  ASA: III  Anesthesia Plan: General   Post-op Pain Management:    Induction: Intravenous  PONV Risk Score and Plan: 1 and Ondansetron  Airway Management Planned: Oral ETT  Additional Equipment:   Intra-op Plan:   Post-operative Plan: Extubation in OR  Informed Consent: I have reviewed the patients History and Physical, chart, labs and discussed the  procedure including the risks, benefits and alternatives for the proposed anesthesia with the patient or authorized representative who has indicated his/her understanding and acceptance.     Dental advisory given  Plan Discussed with: CRNA and Anesthesiologist  Anesthesia Plan Comments:         Anesthesia Quick Evaluation

## 2020-04-16 NOTE — Progress Notes (Signed)
Radiation Oncology I-125 interstitial implant note  Name: Jacob Boyer   Date:   02/23/2020 MRN:  [id number] DOB: 06-03-1945    This 76 y.o. male presents to the hospital today for I-125 interstitial implant  REFERRING PROVIDER: No ref. provider found  HPI: Patient is a 75 year old male with adenocarcinoma the prostate Gleason 7 (4+3) presenting with a PSA of 19.3 stage IIb disease.Marland Kitchen  MRI fusion study showed a PI-RADS 4 lesion in the transition zone no evidence of extracapsular extension of lymphadenopathy.  He opted for I-125 interstitial implant was brought to the OR today and underwent procedure.  COMPLICATIONS OF TREATMENT: none  FOLLOW UP COMPLIANCE: keeps appointments   PHYSICAL EXAM:  BP (P) 138/71   Pulse (P) 80   Temp 98.5 F (36.9 C) (Temporal)   Resp 16   Wt 210 lb (95.3 kg)   SpO2 99%   BMI 33.89 kg/m  Well-developed well-nourished patient in NAD. HEENT reveals PERLA, EOMI, discs not visualized.  Oral cavity is clear. No oral mucosal lesions are identified. Neck is clear without evidence of cervical or supraclavicular adenopathy. Lungs are clear to A&P. Cardiac examination is essentially unremarkable with regular rate and rhythm without murmur rub or thrill. Abdomen is benign with no organomegaly or masses noted. Motor sensory and DTR levels are equal and symmetric in the upper and lower extremities. Cranial nerves II through XII are grossly intact. Proprioception is intact. No peripheral adenopathy or edema is identified. No motor or sensory levels are noted. Crude visual fields are within normal range.  RADIOLOGY RESULTS: Ultrasound used for source placement.  PLAN: Patient was taken to the operating room and general anesthesia was administered. Legs were immobilized in stirrups and patient was positioned in the exact same proportions as original volume study. Patient was prepped and Foley catheter was placed. Ultrasound guidance identified the prostate and recreated the  original set up as per treatment planning volume study.  20 needles were placed under ultrasound guidance with PVCs delivered to the prostate volume.  A total of 55 seeds were placed after completion of procedure cystoscopy was performed by urology and no evidence of seeds in the bladder were noted. Patient tolerated the procedure extremely well. Initial plain film as doublecheck identified 80 seeds in the prostate. Patient has followup appointment in one month for CT scan for quality assurance will be performed.    Jacob Filbert, MD

## 2020-04-16 NOTE — Discharge Instructions (Signed)

## 2020-04-17 ENCOUNTER — Telehealth: Payer: Self-pay | Admitting: Urology

## 2020-04-17 NOTE — Anesthesia Postprocedure Evaluation (Signed)
Anesthesia Post Note  Patient: MIECZYSLAW STAMAS  Procedure(s) Performed: RADIOACTIVE SEED IMPLANT/BRACHYTHERAPY IMPLANT (N/A )  Patient location during evaluation: PACU Anesthesia Type: General Level of consciousness: awake and alert and oriented Pain management: pain level controlled Vital Signs Assessment: post-procedure vital signs reviewed and stable Respiratory status: spontaneous breathing, nonlabored ventilation and respiratory function stable Cardiovascular status: blood pressure returned to baseline and stable Postop Assessment: no signs of nausea or vomiting Anesthetic complications: no   No complications documented.   Last Vitals:  Vitals:   04/16/20 0956 04/16/20 1020  BP: (!) 144/86 (P) 138/71  Pulse: 77 (P) 80  Resp: 16   Temp: 36.9 C   SpO2: 99%     Last Pain:  Vitals:   04/16/20 0956  TempSrc: Temporal  PainSc: 7                  Cathryne Mancebo

## 2020-04-17 NOTE — Telephone Encounter (Signed)
LMOM for pt to call office to schedule a 3 month follow up w/PSA prior

## 2020-04-18 NOTE — Telephone Encounter (Signed)
Patient returned the call and appts made.

## 2020-05-14 ENCOUNTER — Ambulatory Visit: Payer: Medicare Other | Admitting: Radiation Oncology

## 2020-05-14 ENCOUNTER — Ambulatory Visit: Payer: Medicare Other

## 2020-05-29 ENCOUNTER — Ambulatory Visit
Admission: RE | Admit: 2020-05-29 | Discharge: 2020-05-29 | Disposition: A | Discharge status: Home / Self Care | Payer: Medicare Other | Source: Ambulatory Visit | Attending: Radiation Oncology | Admitting: Radiation Oncology

## 2020-05-29 VITALS — BP 147/91 | HR 86 | Wt 203.0 lb

## 2020-05-29 DIAGNOSIS — C61 Malignant neoplasm of prostate: Secondary | ICD-10-CM | POA: Insufficient documentation

## 2020-05-29 DIAGNOSIS — R350 Frequency of micturition: Secondary | ICD-10-CM | POA: Diagnosis not present

## 2020-05-29 NOTE — Progress Notes (Signed)
Radiation Oncology Follow up Note  Name: Jacob Boyer   Date:   05/29/2020 MRN:  [id number] DOB: Mar 12, 1945    This 75 y.o. male presents to the clinic today for 1 month follow-up status post I-125 interstitial implant for Gleason 7 adenocarcinoma the prostate.  REFERRING PROVIDER: Juluis Pitch, MD  HPI: Patient is a 75 year old male with stage IIb adenocarcinoma prostate presenting with a PSA of 19 and a Gleason 7 (4+3).  Seen today 1 month out he is doing fairly well.  He states he does have some frequency and urgency of urination which we expect since the implant is still active.  He specifically denies any significant GI problems.  We performed a CT scan for quality assurance today showing excellent placement of sources..  COMPLICATIONS OF TREATMENT: none  FOLLOW UP COMPLIANCE: keeps appointments   PHYSICAL EXAM:  BP (!) 147/91   Pulse 86   Wt 203 lb (92.1 kg)   BMI 32.77 kg/m  Well-developed well-nourished patient in NAD. HEENT reveals PERLA, EOMI, discs not visualized.  Oral cavity is clear. No oral mucosal lesions are identified. Neck is clear without evidence of cervical or supraclavicular adenopathy. Lungs are clear to A&P. Cardiac examination is essentially unremarkable with regular rate and rhythm without murmur rub or thrill. Abdomen is benign with no organomegaly or masses noted. Motor sensory and DTR levels are equal and symmetric in the upper and lower extremities. Cranial nerves II through XII are grossly intact. Proprioception is intact. No peripheral adenopathy or edema is identified. No motor or sensory levels are noted. Crude visual fields are within normal range.  RADIOLOGY RESULTS: CT scan reviewed compatible with above-stated findings showing excellent places of sources QA to be performed.  PLAN: Present time patient is doing well he has side effects consistent with current interstitial implant.  I have asked to see him back in 3 to 4 months for follow-up with a  PSA at that time.  Patient is to call with any concerns.  I would like to take this opportunity to thank you for allowing me to participate in the care of your patient.Noreene Filbert, MD

## 2020-06-05 ENCOUNTER — Other Ambulatory Visit: Payer: Self-pay | Admitting: *Deleted

## 2020-06-05 DIAGNOSIS — R972 Elevated prostate specific antigen [PSA]: Secondary | ICD-10-CM

## 2020-06-12 DIAGNOSIS — C61 Malignant neoplasm of prostate: Secondary | ICD-10-CM | POA: Diagnosis not present

## 2020-06-14 DIAGNOSIS — C61 Malignant neoplasm of prostate: Secondary | ICD-10-CM | POA: Diagnosis not present

## 2020-07-06 ENCOUNTER — Encounter: Payer: Self-pay | Admitting: *Deleted

## 2020-07-17 ENCOUNTER — Other Ambulatory Visit: Payer: Self-pay

## 2020-07-17 ENCOUNTER — Other Ambulatory Visit: Payer: Medicare Other

## 2020-07-17 DIAGNOSIS — R972 Elevated prostate specific antigen [PSA]: Secondary | ICD-10-CM

## 2020-07-18 LAB — PSA: Prostate Specific Ag, Serum: 1.9 ng/mL (ref 0.0–4.0)

## 2020-07-19 ENCOUNTER — Other Ambulatory Visit: Payer: Self-pay

## 2020-07-19 ENCOUNTER — Encounter: Payer: Self-pay | Admitting: Urology

## 2020-07-19 ENCOUNTER — Ambulatory Visit (INDEPENDENT_AMBULATORY_CARE_PROVIDER_SITE_OTHER): Payer: Medicare Other | Admitting: Urology

## 2020-07-19 VITALS — BP 132/77 | HR 98 | Ht 66.0 in | Wt 210.0 lb

## 2020-07-19 DIAGNOSIS — N528 Other male erectile dysfunction: Secondary | ICD-10-CM | POA: Diagnosis not present

## 2020-07-19 DIAGNOSIS — C61 Malignant neoplasm of prostate: Secondary | ICD-10-CM | POA: Diagnosis not present

## 2020-07-19 MED ORDER — TADALAFIL 5 MG PO TABS: 5.0000 mg | ORAL_TABLET | ORAL | 5 refills | Status: DC

## 2020-07-19 NOTE — Patient Instructions (Signed)
You had prostate cancer that was treated with radiation(brachytherapy).  It is common for men to have trouble with erections after radiation for prostate cancer.  Cialis is a medication that can help improve blood flow to the penis and help with erections.  This medication does not just give you an erection, and you still need sexual stimulation/activity.  The Cialis can also improve urinary symptoms after radiation for prostate cancer by relaxing the prostate.  Take this medication every other day so it is always in your system.  Some patients will prefer to just take this as needed 1 to 2 hours before sexual activity, but this will not benefit your urination.  Start at the lowest dose which is 5 mg every other day.  This can be increased all the way to 20 mg every other day, or 20 mg just as needed before sexual activity.  Side effects are uncommon, but can include a stuffy nose or headache.

## 2020-07-19 NOTE — Progress Notes (Signed)
   07/19/2020 3:45 PM   Jacob Boyer 11/05/1945 [id number]  Reason for visit: Follow up prostate cancer, ED, urinary symptoms  HPI: 75 year old male with a long history of persistently rising PSA with a peak of 19.3 in November 2021. He ultimately underwent a prostate MRI that showed a PI-RADS four lesion in the transition zone, but no evidence of lymphadenopathy or extraprostatic extension. Prostate volume was only 21 g. He underwent a fusion biopsy with nitrous in [place] that showedbenign tissue in the standard 12 core template, but 2/2 cores in the region of interest showed Gleason score 4+3=7 prostateadenocarcinoma,with 90% max core involvement, and 80% pattern 4 disease.  Staging imaging showed no evidence of metastatic disease.  He underwent brachytherapy with myself and Dr. Baruch Gouty on 04/16/2020.  He deferred adjuvant ADT.  PSA is dropped appropriately to 1.9 on 07/17/2020.  He has been doing well since that time.  He has his baseline urinary urgency and frequency that is minimally bothersome.  He did not ever try the Flomax that was prescribed after brachytherapy.  He is not bothered enough to try medication at this time.  He has not been sexually active in a while, but recently met someone is considering resuming sexual activity.  He gets some erections in the morning, but not during the day.  He is interested in trying PDE 5 inhibitors.  Risks and benefits of Cialis discussed at length.  He had a bad headache from Viagra previously, but sounds like he tolerated Cialis in the past.  Trial of Cialis 5 mg every other day for ED, this may also improve his urinary symptoms.  We discussed this dose can be titrated up if needed, with 20 mg being the max dose RTC 6 months with PSA prior  Billey Co, MD  [facility] [address], [place], [place] [address] [phone]

## 2020-08-30 ENCOUNTER — Ambulatory Visit: Payer: Medicare Other | Admitting: Radiation Oncology

## 2020-09-06 ENCOUNTER — Ambulatory Visit: Payer: Medicare Other | Admitting: Radiation Oncology

## 2020-10-01 ENCOUNTER — Ambulatory Visit: Payer: Medicare Other | Admitting: Radiation Oncology

## 2020-10-29 ENCOUNTER — Encounter: Payer: Self-pay | Admitting: Urology

## 2020-10-29 ENCOUNTER — Ambulatory Visit: Payer: Medicare Other | Admitting: Urology

## 2020-10-29 ENCOUNTER — Other Ambulatory Visit: Payer: Self-pay

## 2020-10-29 VITALS — BP 169/84 | HR 71 | Ht 70.0 in | Wt 210.0 lb

## 2020-10-29 DIAGNOSIS — R399 Unspecified symptoms and signs involving the genitourinary system: Secondary | ICD-10-CM

## 2020-10-29 DIAGNOSIS — N528 Other male erectile dysfunction: Secondary | ICD-10-CM | POA: Diagnosis not present

## 2020-10-29 DIAGNOSIS — C61 Malignant neoplasm of prostate: Secondary | ICD-10-CM

## 2020-10-29 MED ORDER — SILDENAFIL CITRATE 100 MG PO TABS: 100.0000 mg | ORAL_TABLET | Freq: Every day | ORAL | 11 refills | Status: DC | PRN

## 2020-10-29 MED ORDER — TADALAFIL 20 MG PO TABS: 20.0000 mg | ORAL_TABLET | ORAL | 6 refills | Status: DC

## 2020-10-29 NOTE — Patient Instructions (Signed)
Erectile Dysfunction °Erectile dysfunction (ED) is the inability to get or keep an erection in order to have sexual intercourse. ED is considered a symptom of an underlying disorder and is not considered a disease. ED may include: °Inability to get an erection. °Lack of enough hardness of the erection to allow penetration. °Loss of erection before sex is finished. °What are the causes? °This condition may be caused by: °Physical causes, such as: °Artery problems. This may include heart disease, high blood pressure, atherosclerosis, and diabetes. °Hormonal problems, such as low testosterone. °Obesity. °Nerve problems. This may include back or pelvic injuries, multiple sclerosis, Parkinson's disease, spinal cord injury, and stroke. °Certain medicines, such as: °Pain relievers. °Antidepressants. °Blood pressure medicines and water pills (diuretics). °Cancer medicines. °Antihistamines. °Muscle relaxants. °Lifestyle factors, such as: °Use of drugs such as marijuana, cocaine, or opioids. °Excessive use of alcohol. °Smoking. °Lack of physical activity or exercise. °Psychological causes, such as: °Anxiety or stress. °Sadness or depression. °Exhaustion. °Fear about sexual performance. °Guilt. °What are the signs or symptoms? °Symptoms of this condition include: °Inability to get an erection. °Lack of enough hardness of the erection to allow penetration. °Loss of the erection before sex is finished. °Sometimes having normal erections, but with frequent unsatisfactory episodes. °Low sexual satisfaction in either partner due to erection problems. °A curved penis occurring with erection. The curve may cause pain, or the penis may be too curved to allow for intercourse. °Never having nighttime or morning erections. °How is this diagnosed? °This condition is often diagnosed by: °Performing a physical exam to find other diseases or specific problems with the penis. °Asking you detailed questions about the problem. °Doing tests,  such as: °Blood tests to check for diabetes mellitus or high cholesterol, or to measure hormone levels. °Other tests to check for underlying health conditions. °An ultrasound exam to check for scarring. °A test to check blood flow to the penis. °Doing a sleep study at home to measure nighttime erections. °How is this treated? °This condition may be treated by: °Medicines, such as: °Medicine taken by mouth to help you achieve an erection (oral medicine). °Hormone replacement therapy to replace low testosterone levels. °Medicine that is injected into the penis. Your health care provider may instruct you how to give yourself these injections at home. °Medicine that is delivered with a short applicator tube. The tube is inserted into the opening at the tip of the penis, which is the opening of the urethra. A tiny pellet of medicine is put in the urethra. The pellet dissolves and enhances erectile function. This is also called MUSE (medicated urethral system for erections) therapy. °Vacuum pump. This is a pump with a ring on it. The pump and ring are placed on the penis and used to create pressure that helps the penis become erect. °Penile implant surgery. In this procedure, you may receive: °An inflatable implant. This consists of cylinders, a pump, and a reservoir. The cylinders can be inflated with a fluid that helps to create an erection, and they can be deflated after intercourse. °A semi-rigid implant. This consists of two silicone rubber rods. The rods provide some rigidity. They are also flexible, so the penis can both curve downward in its normal position and become straight for sexual intercourse. °Blood vessel surgery to improve blood flow to the penis. During this procedure, a blood vessel from a different part of the body is placed into the penis to allow blood to flow around (bypass) damaged or blocked blood vessels. °Lifestyle changes,   such as exercising more, losing weight, and quitting smoking. °Follow  these instructions at home: °Medicines ° °Take over-the-counter and prescription medicines only as told by your health care provider. Do not increase the dosage without first discussing it with your health care provider. °If you are using self-injections, do injections as directed by your health care provider. Make sure you avoid any veins that are on the surface of the penis. After giving an injection, apply pressure to the injection site for 5 minutes. °Talk to your health care provider about how to prevent headaches while taking ED medicines. These medicines may cause a sudden headache due to the increase in blood flow in your body. °General instructions °Exercise regularly, as directed by your health care provider. Work with your health care provider to lose weight, if needed. °Do not use any products that contain nicotine or tobacco. These products include cigarettes, chewing tobacco, and vaping devices, such as e-cigarettes. If you need help quitting, ask your health care provider. °Before using a vacuum pump, read the instructions that come with the pump and discuss any questions with your health care provider. °Keep all follow-up visits. This is important. °Contact a health care provider if: °You feel nauseous. °You are vomiting. °You get sudden headaches while taking ED medicines. °You have any concerns about your sexual health. °Get help right away if: °You are taking oral or injectable medicines and you have an erection that lasts longer than 4 hours. If your health care provider is unavailable, go to the nearest emergency room for evaluation. An erection that lasts much longer than 4 hours can result in permanent damage to your penis. °You have severe pain in your groin or abdomen. °You develop redness or severe swelling of your penis. °You have redness spreading at your groin or lower abdomen. °You are unable to urinate. °You experience chest pain or a rapid heartbeat (palpitations) after taking oral  medicines. °These symptoms may represent a serious problem that is an emergency. Do not wait to see if the symptoms will go away. Get medical help right away. Call your local emergency services (911 in the [place] Do not drive yourself to the hospital. °Summary °Erectile dysfunction (ED) is the inability to get or keep an erection during sexual intercourse. °This condition is diagnosed based on a physical exam, your symptoms, and tests to determine the cause. Treatment varies depending on the cause and may include medicines, hormone therapy, surgery, or a vacuum pump. °You may need follow-up visits to make sure that you are using your medicines or devices correctly. °Get help right away if you are taking or injecting medicines and you have an erection that lasts longer than 4 hours. °This information is not intended to replace advice given to you by your health care provider. Make sure you discuss any questions you have with your health care provider. °Document Revised: 05/02/2020 Document Reviewed: 05/02/2020 °Elsevier Patient Education © 2022 [employer] ° °

## 2020-10-29 NOTE — Progress Notes (Signed)
   10/29/2020 10:43 AM   Jacob Boyer April 06, 1945 [id number]  Reason for visit: History of prostate cancer, ED, urinary symptoms  HPI: 75 year old male with a long history of persistently rising PSA with a peak of 19.3 in November 2021. He ultimately underwent a prostate MRI that showed a PI-RADS four lesion in the transition zone, but no evidence of lymphadenopathy or extraprostatic extension. Prostate volume was only 21 g. 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  Staging imaging showed no evidence of metastatic disease.  He underwent brachytherapy with myself and Dr. Baruch Gouty on 04/16/2020.  He deferred adjuvant ADT.   PSA dropped appropriately to 1.9 on 07/17/2020.  Next PSA due in December 2022.  At our last visit, he recently met someone and was interested in resuming sexual activity.  He had not been sexually active for many years.  He got some erections in the morning, but was interested in a trial of medications to help with erections.  We tried Cialis 5 mg every other day, and he did not notice any significant improvement with this medication over the course of a few weeks.  I recommended a trial of Cialis 20 mg every other day, and he also had questions about the potential for Viagra/sildenafil.  We discussed the advantages of the Cialis including longer half-life and ability to take with food.  I also sent in a prescription for sildenafil 100 mg on demand if he does not get any improvement on the Cialis.  We discussed these medication should not be taken together.  He also is having some mild urinary symptoms of occasional weak stream.  He previously tried Flomax in the distant past for some urinary problems without significant improvement, but he has not yet tried this  since he had radiation for prostate cancer.  I recommended seeing if the Cialis every other day improves his urinary symptoms, and if not considering trying Flomax in the future.  Trial of Cialis 20 mg every other day for ED and mild urinary symptoms RTC 3 months as scheduled for PSA prior    Billey Co, MD  [facility] [address], [place], [place] [address] [phone]

## 2021-01-18 ENCOUNTER — Other Ambulatory Visit: Payer: Self-pay

## 2021-01-23 ENCOUNTER — Ambulatory Visit: Payer: Self-pay | Admitting: Urology

## 2021-01-28 ENCOUNTER — Other Ambulatory Visit: Payer: Medicare Other

## 2021-01-28 ENCOUNTER — Other Ambulatory Visit: Payer: Self-pay

## 2021-01-28 DIAGNOSIS — C61 Malignant neoplasm of prostate: Secondary | ICD-10-CM

## 2021-01-29 LAB — PSA: Prostate Specific Ag, Serum: 0.2 ng/mL (ref 0.0–4.0)

## 2021-01-31 ENCOUNTER — Ambulatory Visit: Payer: Medicare Other | Admitting: Urology

## 2021-02-07 ENCOUNTER — Encounter: Payer: Self-pay | Admitting: Urology

## 2021-02-07 ENCOUNTER — Ambulatory Visit: Payer: Medicare Other | Admitting: Urology

## 2021-02-07 ENCOUNTER — Other Ambulatory Visit: Payer: Self-pay

## 2021-02-07 VITALS — BP 176/97 | HR 82 | Ht 66.0 in | Wt 210.0 lb

## 2021-02-07 DIAGNOSIS — N529 Male erectile dysfunction, unspecified: Secondary | ICD-10-CM | POA: Diagnosis not present

## 2021-02-07 DIAGNOSIS — C61 Malignant neoplasm of prostate: Secondary | ICD-10-CM | POA: Diagnosis not present

## 2021-02-07 NOTE — Progress Notes (Signed)
° °  02/07/2021 3:46 PM   [name] 09-30-1945 [id number]  Reason for visit: Follow up prostate cancer, ED, urinary frequency  HPI: 75 year old male with a long history of persistently rising PSA with a peak of 19.3 in November 2021. He ultimately underwent a prostate MRI that showed a PI-RADS four lesion in the transition zone, but no evidence of lymphadenopathy or extraprostatic extension. Prostate volume was only 21 g. 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  Staging imaging showed no evidence of metastatic disease.  He underwent brachytherapy with myself and Dr. Baruch Gouty on 04/16/2020.  He deferred adjuvant ADT.   PSA dropped appropriately to 1.9 on 07/17/2020, and 0.2 on 01/28/2021.  He has mild urinary symptoms of urinary frequency, but this is minimally bothersome.  Denies any urgency, urge incontinence, or nocturia.  At our last visit he opted for a trial of Cialis 20 mg every other day for ED, and has had good results on this medication.  He is overall extremely happy with how he has been doing since brachytherapy for prostate cancer.  We discussed the need to continue to monitor the PSA moving forward, concept of a PSA bounce after brachytherapy, and risks of recurrence.  Continue Cialis 20 mg every other day RTC 6 months with PSA prior   Billey Co, MD  [facility] [address], [place], [place] [address] [phone]

## 2021-08-01 ENCOUNTER — Other Ambulatory Visit: Payer: Medicare Other

## 2021-08-06 ENCOUNTER — Other Ambulatory Visit: Payer: Medicare Other

## 2021-08-06 DIAGNOSIS — C61 Malignant neoplasm of prostate: Secondary | ICD-10-CM

## 2021-08-07 LAB — PSA: Prostate Specific Ag, Serum: 0.2 ng/mL (ref 0.0–4.0)

## 2021-08-08 ENCOUNTER — Ambulatory Visit: Payer: Medicare Other | Admitting: Urology

## 2021-08-08 ENCOUNTER — Encounter: Payer: Self-pay | Admitting: Urology

## 2021-08-08 VITALS — BP 168/94 | HR 76 | Ht 66.5 in | Wt 206.0 lb

## 2021-08-08 DIAGNOSIS — Z8546 Personal history of malignant neoplasm of prostate: Secondary | ICD-10-CM

## 2021-08-08 DIAGNOSIS — N529 Male erectile dysfunction, unspecified: Secondary | ICD-10-CM | POA: Diagnosis not present

## 2021-08-08 DIAGNOSIS — C61 Malignant neoplasm of prostate: Secondary | ICD-10-CM

## 2021-09-18 ENCOUNTER — Encounter: Payer: Medicare Other | Attending: Family Medicine | Admitting: Nutrition

## 2021-09-18 VITALS — Ht 65.5 in | Wt 211.0 lb

## 2021-09-18 DIAGNOSIS — Z6834 Body mass index (BMI) 34.0-34.9, adult: Secondary | ICD-10-CM | POA: Diagnosis not present

## 2021-09-18 DIAGNOSIS — I10 Essential (primary) hypertension: Secondary | ICD-10-CM

## 2021-09-18 DIAGNOSIS — Z713 Dietary counseling and surveillance: Secondary | ICD-10-CM | POA: Diagnosis not present

## 2021-09-18 DIAGNOSIS — E119 Type 2 diabetes mellitus without complications: Secondary | ICD-10-CM | POA: Insufficient documentation

## 2021-09-18 DIAGNOSIS — Z794 Long term (current) use of insulin: Secondary | ICD-10-CM | POA: Insufficient documentation

## 2021-09-18 DIAGNOSIS — E559 Vitamin D deficiency, unspecified: Secondary | ICD-10-CM

## 2021-09-18 NOTE — Progress Notes (Signed)
Medical Nutrition Therapy  Appointment Start time:  1000  Appointment End time:  30  Primary concerns today: Dm Type 2, Obesity  Referral diagnosis: E11.8, E66.01 Preferred learning style: NO preferences Learning readiness: Ready    NUTRITION ASSESSMENT Follow up 76 yr old bmale here for his DM Type 2 and Obesity.  7 day avg BS 132 mg/dl. Testing once a day 14 day avg, 157 mg/dl. 30 day avg is 154 mg.   Gained 5 lbs. Admits he slipped back into some poor food choices and working to get back on track with more whole plant based foods. His schedule is slowing down and he will have more time to work on meal planning. He is also willing to make more time for himself for better self care and reduce his number of obligations.  Changes: Drinking almond milk now instead of whole milk.  Doesn't eat sweets anymore. No sodas. Eating whole wheat bread.  He is trying to not forget to take his insulin at night.  Feels a lot better.   Has a hectic schedule and now has a [place] but hasn't used it it. Assisted with putting it on today.   Anthropometrics    Wt Readings from Last 3 Encounters:  11/11/21 211 lb (95.7 kg)  08/08/21 206 lb (93.4 kg)  02/07/21 210 lb (95.3 kg)   Ht Readings from Last 3 Encounters:  11/11/21 5' 5.5" (1.664 m)  08/08/21 5' 6.5" (1.689 m)  02/07/21 '5\' 6"'$  (1.676 m)   Body mass index is 34.58 kg/m. '@BMIFA'$ @ Facility age limit for growth %iles is 20 years. Facility age limit for growth %iles is 20 years.   Clinical Medical Hx:  See chart Medications: Semglee 40 units, Metformin 1000 mg BID, Labs:    Notable Signs/Symptoms: None  Lifestyle & Dietary Hx Lives alone. Wife died 20 years.  No relatives. No family. No children close by.  Has a vinyard of muscadines.    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  24-Hr Dietary Recall Usually eats 1-2 meals per day depending on his schedule. 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  Estimated Energy Needs Calories: 1800 Carbohydrate: 200g Protein: 135g Fat: 50g   NUTRITION DIAGNOSIS  NB-1.1 Food and nutrition-related knowledge deficit As related to Diabetes Type 2 and obesity.  As evidenced by A1C > 6.5% and on insulin and BMI 34 .   NUTRITION INTERVENTION  Nutrition education (E-1) on the following topics:  Nutrition and Diabetes education provided on My Plate, CHO counting, meal planning, portion sizes, timing of meals, avoiding snacks between meals unless having a low blood sugar, target ranges for A1C and blood sugars, signs/symptoms and treatment of hyper/hypoglycemia, monitoring blood sugars, taking medications as prescribed, benefits of exercising 30 minutes per day and prevention of complications of DM. Lifestyle Medicine  - Whole Food, Plant Predominant Nutrition is highly recommended: Eat Plenty of vegetables, Mushrooms, fruits, Legumes, Whole Grains, Nuts, seeds in lieu of processed meats, processed snacks/pastries red meat, poultry, eggs.    -It is better to avoid simple carbohydrates including: Cakes, Sweet Desserts, Ice Cream, Soda (diet and regular), Sweet Tea, Candies, Chips, Cookies, Store Bought Juices, Alcohol in Excess of  1-2 drinks a day, Lemonade,  Artificial Sweeteners, Doughnuts, Coffee Creamers, "Sugar-free" Products, etc, etc.  This is not a  complete list.....  Exercise: If you are able: 30 -60 minutes a day ,4 days a week, or 150 minutes a week.  The longer the better.  Combine stretch, strength, and aerobic activities.  If you were told in the past that you have high risk for cardiovascular diseases, you may seek evaluation by your heart doctor prior to initiating moderate to intense exercise programs.   Handouts Provided Include  LIfestyle Handouts  Learning Style  & Readiness for Change Teaching method utilized: Visual & Auditory  Demonstrated degree of understanding via: Teach Back  Barriers to learning/adherence to lifestyle change: motivation  Goals Established by Pt Goals  Eat three meals per day Increase plant based foods Eat dinner before 7 pm. Set alarm on phone to take insulin at night. Get on schedule with meals and medicine. This will allow your medicine to be more effective. Drink a gallon of water per day Cut out snacks Use Libre to see trends and patterns of your blood sugars and make adjustment with eating habits. Lose 1 lb per week.  MONITORING & EVALUATION Dietary intake, weekly physical activity, and weight in 1-3 months.  Next Steps  Patient is to work on eating and taking medications on a schedule.Marland Kitchen

## 2021-11-11 ENCOUNTER — Encounter: Payer: Medicare Other | Attending: Family Medicine | Admitting: Nutrition

## 2021-11-11 DIAGNOSIS — Z794 Long term (current) use of insulin: Secondary | ICD-10-CM | POA: Insufficient documentation

## 2021-11-11 DIAGNOSIS — E119 Type 2 diabetes mellitus without complications: Secondary | ICD-10-CM | POA: Diagnosis present

## 2021-11-11 NOTE — Progress Notes (Unsigned)
Medical Nutrition Therapy  Appointment Start time:  1000  Appointment End time:  15  Primary concerns today: Dm Type 2, Obesity  Referral diagnosis: E11.8, E66.01 Preferred learning style: NO preferences Learning readiness: Ready    NUTRITION ASSESSMENT Follow up "I need help with putting this libre on." Meter shows 7 day avg BS 132 mg/dl. Once a day 14 day 157 mg/dl. 30 day BS 154 mg/dl. Last A1C 3/23 was 9.4% at the [place].  He notes he is so tired sometimes, he forgets to take his insulin at bedtime and may not take pills til noon or after sometimes. Willing to work on setting alarms and get on more of a standard schedule. Changes: I drink almond milk. Doesn't eat sweets anymore. No sodas. Eating whole wheat bread.Trying to eat more fruits and vegetables. 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 He notes he is too busy and struggles with doing things he needs to for himself.  He is willing to work on prioritizing his activities and making time to eat better quality of foods, eat meals on time and get better sleep. He is willing to work on lifestyle medicine. Struggles with limited food choices eating out since he lives by himself and doesn't cook much. Willing to try [employer] meals at times.  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    Denies any symptoms of hyper/hypoglycemia.  Diet has room for improvement in getting rid of processed meats like bologna, biscuits and fast/processed foods.  Anthropometrics    Wt Readings from Last 3 Encounters:  11/11/21 211 lb (95.7 kg)  08/08/21 206 lb (93.4 kg)  02/07/21 210 lb (95.3 kg)   Ht Readings from Last 3 Encounters:  11/11/21 5' 5.5" (1.664 m)  08/08/21 5' 6.5" (1.689 m)  02/07/21 '5\' 6"'$  (1.676 m)   There is no height or weight on file to calculate BMI. '@BMIFA'$ @ Facility age limit for growth  %iles is 20 years. Facility age limit for growth %iles is 20 years.   Clinical Medical Hx:  See chart Medications: Semglee 40 units, Metformin 1000 mg BID, Labs:   Notable Signs/Symptoms: Tired  Lifestyle & Dietary Hx Lives alone. Wife died 15 years.  No relatives. No family. No children close by.  Has a vinyard of muscadines.  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  As evidenced by A1C .   NUTRITION INTERVENTION   CGM Training:  Intervention:   Understanding Glucose Sensing CGM Libre 2 Programming Sensor Information  High Glucose: 180 mg/dl  Low Glucose 70 mg/dl  Other settings to be added at follow up visit Starting [employer]  Entering BG, Furniture conservator/restorer and Graphs with Contractor  Site and Alarms   Nutrition education (E-1) on the following topics:  Nutrition and Diabetes education provided on My Plate, CHO counting, meal planning, portion sizes, timing of meals, avoiding snacks between meals unless having a low blood sugar, target ranges for A1C and blood sugars, signs/symptoms and treatment of hyper/hypoglycemia, monitoring blood sugars, taking medications as prescribed, benefits of exercising 30 minutes per day and prevention of complications of  DM.  Lifestyle Medicine  - Whole Food, Plant Predominant Nutrition is highly recommended: Eat Plenty of vegetables, Mushrooms, fruits, Legumes, Whole Grains, Nuts, seeds in lieu of processed meats, processed snacks/pastries red meat, poultry, eggs.    -It is better to avoid simple carbohydrates including: Cakes, Sweet Desserts, Ice Cream, Soda (diet and regular), Sweet Tea, Candies, Chips, Cookies, Store Bought Juices, Alcohol in Excess of  1-2 drinks a day, Lemonade,  Artificial Sweeteners, Doughnuts, Coffee Creamers, "Sugar-free" Products, etc, etc.  This is not a complete list.....  Exercise: If you are able: 30 -60 minutes a day ,4 days a week, or 150 minutes a week.  The longer the better.  Combine stretch, strength, and aerobic activities.  If you were told in the past that you have high risk for cardiovascular diseases, you may seek evaluation by your heart doctor prior to initiating moderate to intense exercise programs.   Handouts Provided Include  Lifestyle medicine   Learning Style & Readiness for Change Teaching method utilized: Visual & Auditory  Demonstrated degree of understanding via: Teach Back  Barriers to learning/adherence to lifestyle change: busy schedule and not making time for taking care of his health issues.  Goals Established by Pt Goals Take medications and insulin as prescribed. Eat breakfast daily. Set alarm on phone to be able to take insulin at night Priortize schedule Get A1C down to 7%   MONITORING & EVALUATION Dietary intake, weekly physical activity, and blood sugars and weight in 1 month.  Next Steps  Patient is to work on organizing schedule to make time for self care.

## 2021-11-11 NOTE — Patient Instructions (Signed)
Goals  Eat breakfast daily. Take medications and insulin as prescribed. Set alarm on phone to be able to take insulin at night Priortize schedule Get A1C down to 7%

## 2021-11-12 ENCOUNTER — Encounter: Payer: Self-pay | Admitting: Nutrition

## 2021-11-21 ENCOUNTER — Encounter: Payer: Self-pay | Admitting: Nutrition

## 2021-11-21 NOTE — Patient Instructions (Signed)
Goals  Eat three meals per day Increase plant based foods Eat dinner before 7 pm. Set alarm on phone to take insulin at night. Get on schedule with meals and medicine. This will allow your medicine to be more effective. Drink a gallon of water per day Cut out snacks Use Libre to see trends and patterns of your blood sugars and make adjustment with eating habits. Lose 1 lb per week.

## 2021-12-09 ENCOUNTER — Ambulatory Visit: Payer: Medicare Other | Admitting: Nutrition

## 2021-12-23 LAB — BASIC METABOLIC PANEL
BUN: 18 (ref 4–21)
Creatinine: 1.1 (ref 0.6–1.3)

## 2021-12-23 LAB — HEMOGLOBIN A1C: Hemoglobin A1C: 8.7

## 2022-01-15 ENCOUNTER — Ambulatory Visit: Payer: Medicare Other | Admitting: "Endocrinology

## 2022-01-15 ENCOUNTER — Encounter: Payer: Self-pay | Admitting: "Endocrinology

## 2022-01-15 VITALS — BP 152/96 | HR 84 | Ht 66.5 in | Wt 214.0 lb

## 2022-01-15 DIAGNOSIS — Z794 Long term (current) use of insulin: Secondary | ICD-10-CM | POA: Diagnosis not present

## 2022-01-15 DIAGNOSIS — E1165 Type 2 diabetes mellitus with hyperglycemia: Secondary | ICD-10-CM | POA: Diagnosis not present

## 2022-01-15 DIAGNOSIS — I10 Essential (primary) hypertension: Secondary | ICD-10-CM

## 2022-01-15 DIAGNOSIS — E559 Vitamin D deficiency, unspecified: Secondary | ICD-10-CM

## 2022-01-15 MED ORDER — FREESTYLE LIBRE 2 READER DEVI: 0 refills | Status: AC

## 2022-01-15 MED ORDER — METFORMIN HCL ER 500 MG PO TB24: 500.0000 mg | ORAL_TABLET | Freq: Every day | ORAL | 1 refills | Status: DC

## 2022-01-15 MED ORDER — FREESTYLE LIBRE 2 SENSOR MISC: 1.0000 | 3 refills | Status: AC

## 2022-01-15 NOTE — Progress Notes (Signed)
Endocrinology Consult Note       01/15/2022, 8:03 PM   Subjective:    Patient ID: Jacob Boyer, male    DOB: 01-24-1946.  [name] is being seen in consultation for management of currently uncontrolled symptomatic diabetes requested by  Juluis Pitch, MD.   Past Medical History:  Diagnosis Date   Anxiety    Cancer [facility])    Diabetes mellitus without complication ([place])    Esophageal motility disorder    H/O cervical spine surgery    Hypertension    Hypotestosteronism    Prostate cancer ([place])    Seasonal allergic rhinitis    Sleep apnea    HAS NOT USED CPAP IN OVER 20 YEARS    Past Surgical History:  Procedure Laterality Date   COLONOSCOPY WITH PROPOFOL N/A 03/06/2015   Procedure: COLONOSCOPY WITH PROPOFOL;  Surgeon: Lollie Sails, MD;  Location: [facility] ENDOSCOPY;  Service: Endoscopy;  Laterality: N/A;   MOUTH SURGERY     RADIOACTIVE SEED IMPLANT N/A 04/16/2020   Procedure: RADIOACTIVE SEED IMPLANT/BRACHYTHERAPY IMPLANT;  Surgeon: Billey Co, MD;  Location: [facility] ORS;  Service: Urology;  Laterality: N/A;   TONSILLECTOMY     AND ADENOIDS    Social History   Socioeconomic History   Marital status: Widowed    Spouse name: Not on file   Number of children: Not on file   Years of education: Not on file   Highest education level: Not on file  Occupational History   Not on file  Tobacco Use   Smoking status: Former    Packs/day: 0.50    Years: 5.00    Total pack years: 2.50    Types: Cigarettes    Quit date: 04/10/1995    Years since quitting: 26.7    Passive exposure: Past   Smokeless tobacco: Never  Vaping Use   Vaping Use: Never used  Substance and Sexual Activity   Alcohol use: Yes    Comment: RARE   Drug use: No   Sexual activity: Yes    Birth control/protection: None  Other Topics Concern   Not on file  Social History Narrative   Not on file   Social  Determinants of Health   Financial Resource Strain: Not on file  Food Insecurity: Not on file  Transportation Needs: Not on file  Physical Activity: Not on file  Stress: Not on file  Social Connections: Not on file    Family History  Problem Relation Age of Onset   Prostate cancer Neg Hx    Kidney cancer Neg Hx    Bladder Cancer Neg Hx     Outpatient Encounter Medications as of 01/15/2022  Medication Sig   insulin glargine (LANTUS) 100 UNIT/ML Solostar Pen Inject 50 Units into the skin at bedtime. Start 01/16/2022   metFORMIN (GLUCOPHAGE-XR) 500 MG 24 hr tablet Take 1 tablet (500 mg total) by mouth daily with breakfast.   cholecalciferol (VITAMIN D) 25 MCG (1000 UNIT) tablet Take 1,000 Units by mouth daily.   fluticasone (FLONASE) 50 MCG/ACT nasal spray Place 1 spray into both nostrils as needed.   losartan-hydrochlorothiazide (HYZAAR) 100-25  MG tablet Take 1 tablet by mouth every morning.   [DISCONTINUED] insulin glargine-yfgn (SEMGLEE) 100 UNIT/ML injection INJECT 40 UNITS UNDER SKIN EVERY DAY FOR DIABETES MELLITUS - DO NOT MIX WITH OTHER INSULINS IN THE SAME SYRINGE-DISCARD BOTTLE 28 DAYS AFTER OPENING**REPLACES LANTUS**   [DISCONTINUED] metFORMIN (GLUCOPHAGE) 500 MG tablet Take 1,000 mg by mouth 2 (two) times daily with a meal.   [DISCONTINUED] sildenafil (VIAGRA) 100 MG tablet Take 1 tablet (100 mg total) by mouth daily as needed for erectile dysfunction.   [DISCONTINUED] tadalafil (CIALIS) 20 MG tablet Take 1 tablet (20 mg total) by mouth every other day.   No facility-administered encounter medications on file as of 01/15/2022.    ALLERGIES: No Known Allergies  VACCINATION STATUS: Immunization History  Administered Date(s) Administered   Fluad Quad(high Dose 65+) 12/13/2019   Influenza-Unspecified 11/17/2012, 10/31/2014, 10/18/2016, 10/30/2016   PFIZER Comirnaty(Gray Top)Covid-19 Tri-Sucrose Vaccine 04/19/2019, 05/10/2019, 11/29/2019   PFIZER(Purple Top)SARS-COV-2  Vaccination 04/19/2019, 05/10/2019, 11/29/2019   Pneumococcal Conjugate-13 01/30/2015   Pneumococcal Polysaccharide-23 09/09/2012   Tdap 09/09/2012   Zoster Recombinat (Shingrix) 12/13/2019, 04/24/2020    Diabetes He presents for his initial diabetic visit. He has type 2 diabetes mellitus. Onset time: he was diagnosed at apprx age of 24 yrs. His disease course has been fluctuating. There are no hypoglycemic associated symptoms. Pertinent negatives for hypoglycemia include no confusion, headaches, pallor or seizures. Associated symptoms include polydipsia and polyuria. Pertinent negatives for diabetes include no chest pain, no fatigue, no polyphagia and no weakness. There are no hypoglycemic complications. Symptoms are worsening. There are no diabetic complications. Risk factors for coronary artery disease include diabetes mellitus, hypertension, male sex, sedentary lifestyle, obesity and tobacco exposure. Current diabetic treatment includes insulin injections (he is currently on Lantus 30 units BID.). His weight is fluctuating minimally. He is following a generally unhealthy diet. When asked about meal planning, he reported none. He has had a previous visit with a dietitian. He rarely participates in exercise. His home blood glucose trend is fluctuating minimally. His breakfast blood glucose range is generally 130-140 mg/dl. (He only checks rarely and at fasting. His recent A1c was 8.7%. ) An ACE inhibitor/angiotensin II receptor blocker is being taken. Eye exam is current.  Hypertension This is a chronic problem. The current episode started more than 1 year ago. Pertinent negatives include no chest pain, headaches, neck pain, palpitations or shortness of breath. Risk factors for coronary artery disease include diabetes mellitus, obesity, male gender, sedentary lifestyle and smoking/tobacco exposure. Past treatments include angiotensin blockers.     Review of Systems  Constitutional:  Negative for  chills, fatigue, fever and unexpected weight change.  HENT:  Negative for dental problem, mouth sores and trouble swallowing.   Eyes:  Negative for visual disturbance.  Respiratory:  Negative for cough, choking, chest tightness, shortness of breath and wheezing.   Cardiovascular:  Negative for chest pain, palpitations and leg swelling.  Gastrointestinal:  Negative for abdominal distention, abdominal pain, constipation, diarrhea, nausea and vomiting.  Endocrine: Positive for polydipsia and polyuria. Negative for polyphagia.  Genitourinary:  Negative for dysuria, flank pain, hematuria and urgency.  Musculoskeletal:  Negative for back pain, gait problem, myalgias and neck pain.  Skin:  Negative for pallor, rash and wound.  Neurological:  Negative for seizures, syncope, weakness, numbness and headaches.  Psychiatric/Behavioral: Negative.  Negative for confusion and dysphoric mood.     Objective:       01/15/2022    2:24 PM 11/11/2021   10:00 AM 08/08/2021  3:26 PM  Vitals with BMI  Height 5' 6.5" 5' 5.5" 5' 6.5"  Weight 214 lbs 211 lbs 206 lbs  BMI 34.03 93.81 01.75  Systolic 102  585  Diastolic 96  94  Pulse 84  76    BP (!) 152/96   Pulse 84   Ht 5' 6.5" (1.689 m)   Wt 214 lb (97.1 kg)   BMI 34.02 kg/m   Wt Readings from Last 3 Encounters:  01/15/22 214 lb (97.1 kg)  11/11/21 211 lb (95.7 kg)  08/08/21 206 lb (93.4 kg)     Physical Exam Constitutional:      General: He is not in acute distress.    Appearance: He is well-developed.  HENT:     Head: Normocephalic and atraumatic.  Neck:     Thyroid: No thyromegaly.     Trachea: No tracheal deviation.  Cardiovascular:     Rate and Rhythm: Normal rate.     Pulses:          Dorsalis pedis pulses are 1+ on the right side and 1+ on the left side.       Posterior tibial pulses are 1+ on the right side and 1+ on the left side.     Heart sounds: Normal heart sounds, S1 normal and S2 normal. No murmur heard.    No gallop.   Pulmonary:     Effort: No respiratory distress.     Breath sounds: Normal breath sounds. No wheezing.  Abdominal:     General: Bowel sounds are normal. There is no distension.     Palpations: Abdomen is soft.     Tenderness: There is no abdominal tenderness. There is no guarding.  Musculoskeletal:     Right shoulder: No swelling or deformity.     Cervical back: Normal range of motion and neck supple.  Skin:    General: Skin is warm and dry.     Findings: No rash.     Nails: There is no clubbing.  Neurological:     Mental Status: He is alert and oriented to person, place, and time.     Cranial Nerves: No cranial nerve deficit.     Sensory: No sensory deficit.     Gait: Gait normal.     Deep Tendon Reflexes: Reflexes are normal and symmetric.  Psychiatric:        Speech: Speech normal.        Behavior: Behavior normal. Behavior is cooperative.        Thought Content: Thought content normal.        Judgment: Judgment normal.     Recent Results (from the past 2160 hour(s))  Basic metabolic panel     Status: None   Collection Time: 12/23/21 12:00 AM  Result Value Ref Range   BUN 18 4 - 21   Creatinine 1.1 0.6 - 1.3  Hemoglobin A1c     Status: None   Collection Time: 12/23/21 12:00 AM  Result Value Ref Range   Hemoglobin A1C 8.7       Assessment & Plan:   1. Type 2 diabetes mellitus with hyperglycemia, with long-term current use of insulin ([place])    - GUS LITTLER has currently uncontrolled symptomatic type 2 DM since  76 years of age,  with most recent A1c of 8.7 %. Recent labs reviewed. - I had a long discussion with him about the possible risk factors and  the pathology behind its diabetes and its complications. -his diabetes  is complicated by comorbid HTN, obesity/sedentary life and he remains at a high risk for more acute and chronic complications which include CAD, CVA, CKD, retinopathy, and neuropathy. These are all discussed in detail with him.  - I discussed  all available options of managing his diabetes including de-escalation of medications. I have counseled him on Food as Medicine by adopting a Whole Food , Plant Predominant  ( WFPP) nutrition as recommended by [employer] of Lifestyle Medicine. Patient is encouraged to switch to  unprocessed or minimally processed  complex starch, adequate protein intake (mainly plant source), minimal liquid fat, plenty of fruits, and vegetables. -  he is advised to stick to a routine mealtimes to eat 3 complete meals a day and snack only when necessary ( to snack only to correct hypoglycemia BG <70 day time or <100 at night).   - he acknowledges that there is a room for improvement in his food and drink choices. - Further Specific Suggestion is made for him to avoid simple carbohydrates  from his diet including Cakes, Sweet Desserts, Ice Cream, Soda (diet and regular), Sweet Tea, Candies, Chips, Cookies, Store Bought Juices, Alcohol ,  Artificial Sweeteners,  Coffee Creamer, and "Sugar-free" Products. This will help patient to have more stable blood glucose profile and potentially avoid unintended weight gain.  The following Lifestyle Medicine recommendations according to [facility]) were discussed and offered to patient and he agrees to start the journey:  A.  Whole Foods, Plant-based plate comprising of fruits and vegetables, plant-based proteins, whole-grain carbohydrates was discussed in detail with the patient.   A list for source of those nutrients were also provided to the patient.  Patient will use only water or unsweetened tea for hydration. B.  The need to stay away from risky substances including alcohol, smoking; obtaining 7 to 9 hours of restorative sleep, at least 150 minutes of moderate intensity exercise weekly, the importance of healthy social connections,  and stress reduction techniques were discussed. C.  A full color page of  Calorie density of various food groups  per pound showing examples of each food groups was provided to the patient.  - he will be scheduled with Jearld Fenton, RDN, CDE for individualized diabetes education.  - I have approached him with the following individualized plan to manage  his diabetes and patient agrees:   - he will be continued on adjusted dose of Lantus 50 units qhs associated with strict monitoring of glucose 2 times a day-before breakfast and at bedtime. - he is warned not to take insulin without proper monitoring per orders.  - he is encouraged to call clinic for blood glucose levels less than 70 or above 300 mg /dl. - He will benefit from re-initiation of Metformin- '500mg'$  XR po daily at breakfast. - this patient will benefit from a CGM, will send in a rx for Freestyle Libre to his pharmacy.  - he will be considered for incretin therapy as appropriate next visit.  - Specific targets for  A1c;  LDL, HDL,  and Triglycerides were discussed with the patient.  2) Blood Pressure /Hypertension:  his blood pressure is controlled to target.   he is advised to continue his current medications including  Losartan/HCTZ 100/25 mg p.o. daily with breakfast .  3) Lipids/Hyperlipidemia:   No recent lipid panel to review.  he  is not on statin. He will be considered for fasting lipid panel on subsequent visits.   4)  Weight/Diet:  Body mass index is 34.02 kg/m.  -  clearly complicating his diabetes care.   he is a candidate for weight loss. I discussed with him the fact that loss of 5 - 10% of his  current body weight will have the most impact on his diabetes management.  The above detailed  ACLM recommendations for nutrition, exercise, sleep, social life, avoidance of risky substances, the need for restorative sleep   information will also detailed on discharge instructions.  5) Chronic Care/Health Maintenance:  -he  is on ARB  medications and  is encouraged to initiate and continue to follow up with Ophthalmology, Dentist,   Podiatrist at least yearly or according to recommendations, and advised to   stay away from smoking. I have recommended yearly flu vaccine and pneumonia vaccine at least every 5 years; moderate intensity exercise for up to 150 minutes weekly; and  sleep for 7- 9 hours a day.  He is advised to continue vitamin D 1K units daily.  - he is  advised to maintain close follow up with Juluis Pitch, MD for primary care needs, as well as his other providers for optimal and coordinated care.   I spent 62 minutes in the care of the patient today including review of labs from [facility], Lipids, Thyroid Function, Hematology (current and previous including abstractions from other facilities); face-to-face time discussing  his blood glucose readings/logs, discussing hypoglycemia and hyperglycemia episodes and symptoms, medications doses, his options of short and long term treatment based on the latest standards of care / guidelines;  discussion about incorporating lifestyle medicine;  and documenting the encounter. Risk reduction counseling performed per USPSTF guidelines to reduce obesity and cardiovascular risk factors.      Please refer to Patient Instructions for Blood Glucose Monitoring and Insulin/Medications Dosing Guide"  in media tab for additional information. Please  also refer to " Patient Self Inventory" in the Media  tab for reviewed elements of pertinent patient history.  [name] participated in the discussions, expressed understanding, and voiced agreement with the above plans.  All questions were answered to his satisfaction. he is encouraged to contact clinic should he have any questions or concerns prior to his return visit.   Follow up plan: - Return in about 9 weeks (around 03/19/2022) for Bring Meter/CGM Device/Logs- A1c in Office.  Glade Lloyd, MD [facility] [address] [place], [place] [address] Phone: [phone]  Fax:  [phone]    01/15/2022, 8:03 PM  This note was partially dictated with voice recognition software. Similar sounding words can be transcribed inadequately or may not  be corrected upon review.

## 2022-01-15 NOTE — Patient Instructions (Signed)

## 2022-01-16 ENCOUNTER — Telehealth: Payer: Self-pay

## 2022-01-16 NOTE — Telephone Encounter (Signed)
Order for [employer] 2 CGM sent to [facility].

## 2022-03-18 ENCOUNTER — Ambulatory Visit: Payer: Medicare Other | Admitting: "Endocrinology

## 2022-08-05 ENCOUNTER — Ambulatory Visit
Admission: RE | Admit: 2022-08-05 | Discharge: 2022-08-05 | Disposition: A | Discharge status: Home / Self Care | Payer: Medicare Other | Source: Ambulatory Visit | Attending: Family Medicine | Admitting: Family Medicine

## 2022-08-05 ENCOUNTER — Other Ambulatory Visit: Payer: Self-pay | Admitting: Family Medicine

## 2022-08-05 DIAGNOSIS — M79604 Pain in right leg: Secondary | ICD-10-CM

## 2022-08-07 ENCOUNTER — Other Ambulatory Visit: Payer: Self-pay | Admitting: *Deleted

## 2022-08-07 DIAGNOSIS — C61 Malignant neoplasm of prostate: Secondary | ICD-10-CM

## 2022-08-08 ENCOUNTER — Other Ambulatory Visit: Payer: Medicare Other

## 2022-08-08 DIAGNOSIS — C61 Malignant neoplasm of prostate: Secondary | ICD-10-CM

## 2022-08-09 LAB — PSA: Prostate Specific Ag, Serum: 0.7 ng/mL (ref 0.0–4.0)

## 2022-08-14 ENCOUNTER — Ambulatory Visit: Payer: Medicare Other | Admitting: Urology

## 2022-08-14 ENCOUNTER — Encounter: Payer: Self-pay | Admitting: Urology

## 2022-08-14 VITALS — BP 159/83 | HR 82 | Ht 66.5 in | Wt 215.0 lb

## 2022-08-14 DIAGNOSIS — N529 Male erectile dysfunction, unspecified: Secondary | ICD-10-CM | POA: Diagnosis not present

## 2022-08-14 DIAGNOSIS — C61 Malignant neoplasm of prostate: Secondary | ICD-10-CM

## 2022-08-14 NOTE — Patient Instructions (Signed)

## 2022-08-14 NOTE — Progress Notes (Signed)
   08/14/2022 3:26 PM   Jacob Boyer 11/27/1945 [id number]  Reason for visit: Follow up prostate cancer, ED, urinary frequency  HPI: 77 year old male with a long history of persistently rising PSA with a peak of 19.3 in November 2021. He ultimately underwent a prostate MRI that showed a PI-RADS four lesion in the transition zone, but no evidence of lymphadenopathy or extraprostatic extension. Prostate volume was only 21 g. He underwent a fusion biopsy with nitrous in [place] that showed benign tissue in the standard 12 core template, but 2/2 cores in the region of interest showed Gleason score 4+3=7 prostate adenocarcinoma, with 90% max core involvement, and 80% pattern 4 disease.  Staging imaging showed no evidence of metastatic disease.  He underwent brachytherapy with myself and Dr. Rushie Chestnut on 04/16/2020.  He deferred adjuvant ADT.   PSA dropped appropriately to 1.9 on 07/17/2020,  0.2 on 01/28/2021, 0.12 January 2022, most recently 0.25 July 2022.  We discussed the concept of a PSA bounce after brachytherapy and the importance of continuing to monitor the PSA.  Recommend repeat PSA in 6 months.  He has mild urinary symptoms of urinary frequency, but this is minimally bothersome.  Denies any urgency, urge incontinence, or nocturia.  We discussed avoiding bladder irritants and improved diabetes control.  We previously tried Cialis 20 mg every other day for ED which was originally working well for him, however has significantly decreased in effectiveness over the last year.  He previously tried Viagra through PCP which gave him a severe headache.  He is interested in other treatment options for ED.  We reviewed options including vacuum erection device, penile injections, or penile prosthesis.  He is unsure how he would like to proceed, and extensive patient information was provided today.  -Extensive information about ED provided, he will contact us if he is interested in pursuing penile  injections, or referral to Dr. Lafonda Mosses in [place] to consider penile prosthesis -RTC 6 months lab visit for PSA, call with results   Sondra Come, MD  [facility] [address] [place], [place] [address] [phone]

## 2022-09-16 ENCOUNTER — Encounter: Payer: Self-pay | Admitting: Oncology

## 2022-09-16 ENCOUNTER — Inpatient Hospital Stay: Payer: Medicare Other

## 2022-09-16 ENCOUNTER — Inpatient Hospital Stay: Payer: Medicare Other | Attending: Oncology | Admitting: Oncology

## 2022-09-16 VITALS — BP 142/82 | HR 69 | Temp 97.1°F | Resp 18 | Ht 66.5 in | Wt 221.6 lb

## 2022-09-16 DIAGNOSIS — Z79899 Other long term (current) drug therapy: Secondary | ICD-10-CM | POA: Diagnosis not present

## 2022-09-16 DIAGNOSIS — I10 Essential (primary) hypertension: Secondary | ICD-10-CM | POA: Diagnosis not present

## 2022-09-16 DIAGNOSIS — I82411 Acute embolism and thrombosis of right femoral vein: Secondary | ICD-10-CM | POA: Insufficient documentation

## 2022-09-16 DIAGNOSIS — E559 Vitamin D deficiency, unspecified: Secondary | ICD-10-CM | POA: Insufficient documentation

## 2022-09-16 DIAGNOSIS — C61 Malignant neoplasm of prostate: Secondary | ICD-10-CM

## 2022-09-16 DIAGNOSIS — Z9089 Acquired absence of other organs: Secondary | ICD-10-CM | POA: Insufficient documentation

## 2022-09-16 DIAGNOSIS — E1165 Type 2 diabetes mellitus with hyperglycemia: Secondary | ICD-10-CM | POA: Diagnosis not present

## 2022-09-16 DIAGNOSIS — Z87891 Personal history of nicotine dependence: Secondary | ICD-10-CM | POA: Insufficient documentation

## 2022-09-16 DIAGNOSIS — I824Y1 Acute embolism and thrombosis of unspecified deep veins of right proximal lower extremity: Secondary | ICD-10-CM

## 2022-09-16 LAB — ANTITHROMBIN III: AntiThromb III Func: 101 % (ref 75–120)

## 2022-09-21 NOTE — Progress Notes (Signed)
Hematology/Oncology Consult note [facility] Telephone:([phone] [phone]  Patient Care Team: Dorothey Baseman, MD as PCP - General (Family Medicine) Carmina Miller, MD as Radiation Oncologist (Radiation Oncology) Creig Hines, MD as Consulting Physician (Oncology)   Name of the patient: Jacob Boyer  [id number]  07/31/1945    Reason for referral-possible acute DVT of the right femoral vein   Referring physician-Dr. Terance Hart  Date of visit: 09/21/22   History of presenting illness-patient is a 77 year old African-American male with no prior history of any thromboembolic events.  He noticed superficial redness, pain and knot-like sensation in his right leg in June 2024 which persisted for about 2 to 3 weeks.  This was followed by right lower extremity ultrasound which showed an age indeterminant occlusive DVT involving the mid aspect of the duplicated division of the femoral vein.  Adjacent dominant division of femoral vein appears widely patent.  Chronic etiology is favored though technically is age indeterminate given lack of prior comparison.  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  Patient denies any history of trauma to the right lower extremity or prolonged immobilization.  He is a former-smoker.  ECOG PS- 1  Pain scale- 0   Review of systems- Review of Systems  Constitutional:  Negative for chills, fever, malaise/fatigue and weight loss.  HENT:  Negative for congestion, ear discharge and nosebleeds.   Eyes:  Negative for blurred vision.  Respiratory:  Negative for cough, hemoptysis, sputum production, shortness of breath and wheezing.   Cardiovascular:  Negative for chest pain, palpitations, orthopnea and claudication.  Gastrointestinal:  Negative for  abdominal pain, blood in stool, constipation, diarrhea, heartburn, melena, nausea and vomiting.  Genitourinary:  Negative for dysuria, flank pain, frequency, hematuria and urgency.  Musculoskeletal:  Negative for back pain, joint pain and myalgias.  Skin:  Negative for rash.  Neurological:  Negative for dizziness, tingling, focal weakness, seizures, weakness and headaches.  Endo/Heme/Allergies:  Does not bruise/bleed easily.  Psychiatric/Behavioral:  Negative for depression and suicidal ideas. The patient does not have insomnia.     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   Essential hypertension, benign 03/28/2020   Hypotestosteronism 03/28/2020   Seasonal allergic rhinitis 03/28/2020   Prostate cancer (HCC) 03/19/2020   Rising PSA level 07/15/2019   Type 2 diabetes mellitus with hyperglycemia, with long-term current use of insulin (HCC) 02/05/2017     Past Medical History:  Diagnosis Date   Anxiety    Cancer (HCC)    Diabetes mellitus without complication (HCC)    Esophageal motility disorder    H/O cervical spine surgery    Hypertension    Hypotestosteronism    Prostate cancer (HCC)    Seasonal allergic rhinitis    Sleep apnea    HAS NOT USED CPAP IN OVER 20 YEARS     Past Surgical History:  Procedure Laterality Date   COLONOSCOPY WITH PROPOFOL N/A 03/06/2015   Procedure: COLONOSCOPY WITH PROPOFOL;  Surgeon: Christena Deem, MD;  Location: [facility] ENDOSCOPY;  Service: Endoscopy;  Laterality: N/A;   MOUTH SURGERY     RADIOACTIVE SEED IMPLANT N/A 04/16/2020   Procedure: RADIOACTIVE SEED IMPLANT/BRACHYTHERAPY IMPLANT;  Surgeon: Sondra Come, MD;  Location: [facility] ORS;  Service: Urology;  Laterality: N/A;  TONSILLECTOMY     AND ADENOIDS    Social History   Socioeconomic History   Marital status: Widowed    Spouse name: Not on file   Number of children: Not  on file   Years of education: Not on file   Highest education level: Not on file  Occupational History   Not on file  Tobacco Use   Smoking status: Former    Current packs/day: 0.00    Average packs/day: 0.5 packs/day for 5.0 years (2.5 ttl pk-yrs)    Types: Cigarettes    Start date: 04/09/1990    Quit date: 04/10/1995    Years since quitting: 27.4    Passive exposure: Past   Smokeless tobacco: Never  Vaping Use   Vaping status: Never Used  Substance and Sexual Activity   Alcohol use: Yes    Comment: RARE   Drug use: No   Sexual activity: Yes    Birth control/protection: None  Other Topics Concern   Not on file  Social History Narrative   Not on file   Social Determinants of Health   Financial Resource Strain: Not on file  Food Insecurity: No Food Insecurity (09/16/2022)   Hunger Vital Sign    Worried About Running Out of Food in the Last Year: Never true    Ran Out of Food in the Last Year: Never true  Transportation Needs: No Transportation Needs (09/16/2022)   PRAPARE - Administrator, Civil Service (Medical): No    Lack of Transportation (Non-Medical): No  Physical Activity: Not on file  Stress: Not on file  Social Connections: Not on file  Intimate Partner Violence: Not At Risk (09/16/2022)   Humiliation, Afraid, Rape, and Kick questionnaire    Fear of Current or Ex-Partner: No    Emotionally Abused: No    Physically Abused: No    Sexually Abused: No     Family History  Problem Relation Age of Onset   Prostate cancer Neg Hx    Kidney cancer Neg Hx    Bladder Cancer Neg Hx      Current Outpatient Medications:    atorvastatin (LIPITOR) 20 MG tablet, Take 20 mg by mouth daily., Disp: , Rfl:    cholecalciferol (VITAMIN D) 25 MCG (1000 UNIT) tablet, Take 1,000 Units by mouth daily., Disp: , Rfl:    Continuous Blood Gluc Receiver (FREESTYLE LIBRE 2 READER) DEVI, As directed, Disp: 1 each, Rfl: 0   Continuous Blood Gluc Sensor (FREESTYLE LIBRE 2  SENSOR) MISC, 1 Piece by Does not apply route every 14 (fourteen) days., Disp: 2 each, Rfl: 3   ELIQUIS DVT/PE STARTER PACK, Take by mouth., Disp: , Rfl:    empagliflozin (JARDIANCE) 25 MG TABS tablet, Take by mouth., Disp: , Rfl:    fluticasone (FLONASE) 50 MCG/ACT nasal spray, Place 1 spray into both nostrils as needed., Disp: , Rfl:    Insulin Glargine Solostar (LANTUS) 100 UNIT/ML Solostar Pen, Inject into the skin. Inject 50 Units subcutaneously once daily 30 units Qam 25 units QPM, Disp: , Rfl:    insulin glargine-yfgn (SEMGLEE) 100 UNIT/ML Pen, Inject into the skin., Disp: , Rfl:    valsartan (DIOVAN) 160 MG tablet, Take 160 mg by mouth daily., Disp: , Rfl:    Physical exam:  Vitals:   09/16/22 1344 09/16/22 1352  BP: (!) 158/97 (!) 142/82  Pulse: 79 69  Resp: 18   Temp: (!) 97.1 F (36.2 C)   TempSrc: Tympanic   SpO2: 99%  Weight: 221 lb 9.6 oz (100.5 kg)   Height: 5' 6.5" (1.689 m)    Physical Exam Cardiovascular:     Rate and Rhythm: Normal rate and regular rhythm.     Heart sounds: Normal heart sounds.  Pulmonary:     Effort: Pulmonary effort is normal.     Breath sounds: Normal breath sounds.  Abdominal:     General: Bowel sounds are normal.     Palpations: Abdomen is soft.  Musculoskeletal:     Comments: Residual hyperpigmentation noted at the site of prior SVT involving the right lower extremity  Skin:    General: Skin is warm and dry.  Neurological:     Mental Status: He is alert and oriented to person, place, and time.           Latest Ref Rng & Units 12/23/2021   12:00 AM  CMP  BUN 4 - 21 18      Creatinine 0.6 - 1.3 1.1         This result is from an external source.      Latest Ref Rng & Units 04/10/2020    3:17 PM  CBC  WBC 4.0 - 10.5 K/uL 6.5   Hemoglobin 13.0 - 17.0 g/dL 40.9   Hematocrit 81.1 - 52.0 % 36.6   Platelets 150 - 400 K/uL 279     No images are attached to the encounter.  No results found.  Assessment and plan- Patient  is a 77 y.o. male referred for age-indeterminate right lower extremity DVT involving the femoral vein  Based on the history it appears that patient had an acute superficial venous thrombosis involving the right greater saphenous vein which may have migrated and involve the deep vein causing a femoral vein DVT.  Although the findings of your chronic clinically this appears to be subacute based on history.  I agree it would be appropriate for patient to continue Eliquis for at least 6 months at this time.  I will do a complete hypercoagulable workup including factor V Leiden, prothrombin gene mutation, antiphospholipid antibody syndrome as well as protein C protein S and Antithrombin III levels.  I will see himBack in 2 to 3 weeks time to discuss the results of blood work.  With regards to continuing anticoagulation after 6 months, we could consider stopping it at 6 months and see if he has a recurrent DVT given that it appears to be secondary to a superficial venous thrombosis.  It would not be unreasonable to offer indefinite anticoagulation as well given that it was a unprovoked event.  We will have this discussion down the line.  Patient is agreeable with the plan above   Thank you for this kind referral and the opportunity to participate in the care of this  Patient   Visit Diagnosis 1. Acute deep vein thrombosis (DVT) of proximal end of right lower extremity (HCC)     Dr. Owens Shark, MD, MPH [facility] at [facility] [phone] 09/21/2022              '

## 2022-09-30 ENCOUNTER — Encounter: Payer: Self-pay | Admitting: Oncology

## 2022-09-30 ENCOUNTER — Inpatient Hospital Stay: Payer: Medicare Other | Attending: Oncology | Admitting: Oncology

## 2022-09-30 VITALS — BP 149/62 | HR 71 | Temp 97.5°F | Resp 18 | Ht 66.5 in | Wt 228.3 lb

## 2022-09-30 DIAGNOSIS — Z9089 Acquired absence of other organs: Secondary | ICD-10-CM | POA: Diagnosis not present

## 2022-09-30 DIAGNOSIS — Z79899 Other long term (current) drug therapy: Secondary | ICD-10-CM | POA: Diagnosis not present

## 2022-09-30 DIAGNOSIS — Z7901 Long term (current) use of anticoagulants: Secondary | ICD-10-CM | POA: Insufficient documentation

## 2022-09-30 DIAGNOSIS — Z87891 Personal history of nicotine dependence: Secondary | ICD-10-CM | POA: Insufficient documentation

## 2022-09-30 DIAGNOSIS — I82411 Acute embolism and thrombosis of right femoral vein: Secondary | ICD-10-CM | POA: Insufficient documentation

## 2022-09-30 DIAGNOSIS — C61 Malignant neoplasm of prostate: Secondary | ICD-10-CM | POA: Insufficient documentation

## 2022-09-30 MED ORDER — APIXABAN 5 MG PO TABS: 5.0000 mg | ORAL_TABLET | Freq: Two times a day (BID) | ORAL | 1 refills | Status: DC

## 2022-09-30 NOTE — Progress Notes (Signed)
Hematology/Oncology Consult note [facility]  Telephone:([phone] [phone]  Patient Care Team: Dorothey Baseman, MD as PCP - General (Family Medicine) Carmina Miller, MD as Radiation Oncologist (Radiation Oncology) Creig Hines, MD as Consulting Physician (Oncology)   Name of the patient: Jacob Boyer  [id number]  [birth year]   Date of visit: 09/30/22  Diagnosis-DVT involving the right femoral vein  Chief complaint/ Reason for visit-discuss results of hypercoagulable workup  Heme/Onc history: patient is a 77 year old African-American male with no prior history of any thromboembolic events.  He noticed superficial redness, pain and knot-like sensation in his right leg in June 2024 which persisted for about 2 to 3 weeks.  This was followed by right lower extremity ultrasound which showed an age indeterminant occlusive DVT involving the mid aspect of the duplicated division of the femoral vein.  Adjacent dominant division of femoral vein appears widely patent.  Chronic etiology is favored though technically is age indeterminate given lack of prior comparison.  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  Patient denies any history of trauma to the right lower extremity or prolonged immobilization.  He is a former-smoker.   Results of hypercoagulable workup including protein C, protein S Antithrombin III levels, factor V Leiden prothrombin gene mutation negative.  No evidence of antiphospholipid antibody syndrome.  Interval history-patient states that he ran out of Eliquis about 2 days ago he still has some heaviness in his right lower extremity especially when he bends his leg    ECOG PS- 1 Pain scale- 0   Review of systems- Review of Systems  Constitutional:   Negative for chills, fever, malaise/fatigue and weight loss.  HENT:  Negative for congestion, ear discharge and nosebleeds.   Eyes:  Negative for blurred vision.  Respiratory:  Negative for cough, hemoptysis, sputum production, shortness of breath and wheezing.   Cardiovascular:  Negative for chest pain, palpitations, orthopnea and claudication.  Gastrointestinal:  Negative for abdominal pain, blood in stool, constipation, diarrhea, heartburn, melena, nausea and vomiting.  Genitourinary:  Negative for dysuria, flank pain, frequency, hematuria and urgency.  Musculoskeletal:  Negative for back pain, joint pain and myalgias.  Skin:  Negative for rash.  Neurological:  Negative for dizziness, tingling, focal weakness, seizures, weakness and headaches.  Endo/Heme/Allergies:  Does not bruise/bleed easily.  Psychiatric/Behavioral:  Negative for depression and suicidal ideas. The patient does not have insomnia.       No Known Allergies   Past Medical History:  Diagnosis Date   Anxiety    Cancer (HCC)    Diabetes mellitus without complication (HCC)    Esophageal motility disorder    H/O cervical spine surgery    Hypertension    Hypotestosteronism    Prostate cancer (HCC)    Seasonal allergic rhinitis    Sleep apnea    HAS NOT USED CPAP IN OVER 20 YEARS     Past Surgical History:  Procedure Laterality Date   COLONOSCOPY WITH PROPOFOL N/A 03/06/2015   Procedure: COLONOSCOPY WITH PROPOFOL;  Surgeon: Christena Deem, MD;  Location: [facility] ENDOSCOPY;  Service: Endoscopy;  Laterality: N/A;   MOUTH SURGERY     RADIOACTIVE SEED IMPLANT N/A 04/16/2020   Procedure: RADIOACTIVE SEED IMPLANT/BRACHYTHERAPY IMPLANT;  Surgeon: Sondra Come, MD;  Location: [facility] ORS;  Service: Urology;  Laterality: N/A;   TONSILLECTOMY  AND ADENOIDS    Social History   Socioeconomic History   Marital status: Widowed    Spouse name: Not on file   Number of children: Not on file   Years of education: Not  on file   Highest education level: Not on file  Occupational History   Not on file  Tobacco Use   Smoking status: Former    Current packs/day: 0.00    Average packs/day: 0.5 packs/day for 5.0 years (2.5 ttl pk-yrs)    Types: Cigarettes    Start date: 04/09/1990    Quit date: 04/10/1995    Years since quitting: 27.4    Passive exposure: Past   Smokeless tobacco: Never  Vaping Use   Vaping status: Never Used  Substance and Sexual Activity   Alcohol use: Yes    Comment: RARE   Drug use: No   Sexual activity: Yes    Birth control/protection: None  Other Topics Concern   Not on file  Social History Narrative   Not on file   Social Determinants of Health   Financial Resource Strain: Not on file  Food Insecurity: No Food Insecurity (09/16/2022)   Hunger Vital Sign    Worried About Running Out of Food in the Last Year: Never true    Ran Out of Food in the Last Year: Never true  Transportation Needs: No Transportation Needs (09/16/2022)   PRAPARE - Administrator, Civil Service (Medical): No    Lack of Transportation (Non-Medical): No  Physical Activity: Not on file  Stress: Not on file  Social Connections: Not on file  Intimate Partner Violence: Not At Risk (09/16/2022)   Humiliation, Afraid, Rape, and Kick questionnaire    Fear of Current or Ex-Partner: No    Emotionally Abused: No    Physically Abused: No    Sexually Abused: No    Family History  Problem Relation Age of Onset   Prostate cancer Neg Hx    Kidney cancer Neg Hx    Bladder Cancer Neg Hx      Current Outpatient Medications:    atorvastatin (LIPITOR) 20 MG tablet, Take 20 mg by mouth daily., Disp: , Rfl:    cholecalciferol (VITAMIN D) 25 MCG (1000 UNIT) tablet, Take 1,000 Units by mouth daily., Disp: , Rfl:    Continuous Blood Gluc Receiver (FREESTYLE LIBRE 2 READER) DEVI, As directed, Disp: 1 each, Rfl: 0   Continuous Blood Gluc Sensor (FREESTYLE LIBRE 2 SENSOR) MISC, 1 Piece by Does not apply  route every 14 (fourteen) days., Disp: 2 each, Rfl: 3   ELIQUIS DVT/PE STARTER PACK, Take by mouth., Disp: , Rfl:    empagliflozin (JARDIANCE) 25 MG TABS tablet, Take by mouth., Disp: , Rfl:    fluticasone (FLONASE) 50 MCG/ACT nasal spray, Place 1 spray into both nostrils as needed., Disp: , Rfl:    Insulin Glargine Solostar (LANTUS) 100 UNIT/ML Solostar Pen, Inject into the skin. Inject 50 Units subcutaneously once daily 30 units Qam 25 units QPM, Disp: , Rfl:    insulin glargine-yfgn (SEMGLEE) 100 UNIT/ML Pen, Inject into the skin., Disp: , Rfl:    valsartan (DIOVAN) 160 MG tablet, Take 160 mg by mouth daily., Disp: , Rfl:   Physical exam:  Vitals:   09/30/22 1519 09/30/22 1523  BP: (!) 151/72 (!) 149/62  Pulse: 72 71  Resp: 18   Temp: (!) 97.5 F (36.4 C)   TempSrc: Tympanic   SpO2: 95%   Weight: 228 lb 4.8 oz (  103.6 kg)   Height: 5' 6.5" (1.689 m)    Physical Exam Cardiovascular:     Rate and Rhythm: Normal rate and regular rhythm.     Heart sounds: Normal heart sounds.  Pulmonary:     Effort: Pulmonary effort is normal.     Breath sounds: Normal breath sounds.  Abdominal:     General: Bowel sounds are normal.     Palpations: Abdomen is soft.  Musculoskeletal:     Right lower leg: No edema.     Left lower leg: No edema.  Skin:    General: Skin is warm and dry.  Neurological:     Mental Status: He is alert and oriented to person, place, and time.         Latest Ref Rng & Units 12/23/2021   12:00 AM  CMP  BUN 4 - 21 18      Creatinine 0.6 - 1.3 1.1         This result is from an external source.      Latest Ref Rng & Units 04/10/2020    3:17 PM  CBC  WBC 4.0 - 10.5 K/uL 6.5   Hemoglobin 13.0 - 17.0 g/dL 44.0   Hematocrit 34.7 - 52.0 % 36.6   Platelets 150 - 400 K/uL 279      Assessment and plan- Patient is a 77 y.o. male with age-indeterminate right lower extremity DVT involving the femoral vein  Hypercoagulable workup was unremarkable.  Patient likely  had a superficial saphenous vein DVT which complicate treatment to a right femoral vein DVT based on ultrasound in 2021 findings.  Would like you to stay on Eliquis for 6 months and following that we will consider discontinuation of anticoagulation since it was age-indeterminate DVT on ultrasound and is not entirely clear if this was truly acute or not.  If patient were to have any recurrent thrombotic events after stopping Eliquis in 6 months he will need to stay on lifelong anticoagulation.  I am refilling his Eliquis prescription at this time and I will see him back in 6 months with CBC with differential CMP and D-dimer level   Visit Diagnosis 1. Deep vein thrombosis (DVT) of femoral vein of right lower extremity, unspecified chronicity (HCC)      Dr. Owens Shark, MD, MPH [facility] at [facility] [phone] 09/30/2022 3:38 PM

## 2023-02-13 ENCOUNTER — Other Ambulatory Visit: Payer: Medicare Other

## 2023-03-31 ENCOUNTER — Inpatient Hospital Stay: Payer: Medicare Other | Admitting: Oncology

## 2023-03-31 ENCOUNTER — Inpatient Hospital Stay: Payer: Medicare Other | Attending: Family Medicine

## 2023-12-02 ENCOUNTER — Ambulatory Visit: Admitting: Urology

## 2023-12-02 ENCOUNTER — Other Ambulatory Visit: Payer: Self-pay

## 2023-12-02 VITALS — BP 148/84 | HR 91 | Wt 228.0 lb

## 2023-12-02 DIAGNOSIS — C61 Malignant neoplasm of prostate: Secondary | ICD-10-CM

## 2023-12-02 DIAGNOSIS — N529 Male erectile dysfunction, unspecified: Secondary | ICD-10-CM | POA: Diagnosis not present

## 2023-12-02 DIAGNOSIS — R399 Unspecified symptoms and signs involving the genitourinary system: Secondary | ICD-10-CM

## 2023-12-02 NOTE — Progress Notes (Signed)
   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  Moderate urinary frequency  Physical Exam: BP (!) 148/84   Pulse 91   Wt 228 lb (103.4 kg)   BMI 36.25 kg/m   Imaging/labs: PSA history reviewed in care everywhere: 2.23 September 2023, 1.24 December 2022, 0.25 August 2022, 0.12 January 2022 Hemoglobin A1c August 2025 10.9 Urinalysis August 2025 benign aside from glucosuria  Today: Continues to have urinary frequency, poorly controlled diabetes likely contributing, declines medications at this point ED not responsive to Cialis , not interested in injections or prosthesis at this time We reviewed his concerning PSA trend with most recent PSA of 2.6.  We reviewed possible etiologies including recurrence of prostate cancer or PSA bounce after brachytherapy  Plan:   Prostate cancer: Concerning PSA trend increasing over the last year.  Repeat PSA today pending.  If PSA greater than 2.5 would recommend PSMA PET scan, if downtrending Jacob continue to closely monitor ED: Not interested in further treatments at this time, refractory to PDE 5 inhibitors Urinary symptoms: Likely related to poorly controlled diabetes and glucosuria, consider OAB medicine in the future if worsening PSA today, call with results, if continues to increase we Jacob order PSMA PET scan   Jacob JAYSON Burnet, MD  [facility] [address], [address] [place], [place] [address] [phone]

## 2023-12-03 ENCOUNTER — Ambulatory Visit: Payer: Self-pay | Admitting: Urology

## 2023-12-03 DIAGNOSIS — R972 Elevated prostate specific antigen [PSA]: Secondary | ICD-10-CM

## 2023-12-03 LAB — PSA: Prostate Specific Ag, Serum: 2.6 ng/mL (ref 0.0–4.0)

## 2023-12-03 NOTE — Telephone Encounter (Signed)
 Called pt informed him of the information below, pt voiced understanding. PET scan ordered.

## 2023-12-03 NOTE — Telephone Encounter (Signed)
-----   Message from Redell JAYSON Burnet sent at 12/03/2023 11:03 AM EDT ----- PSA remains elevated at 2.6.  Like we discussed in clinic yesterday I would recommend a PSMA PET scan(indication recurrent prostate cancer), will call with those results, thank you  Redell Burnet, MD 12/03/2023  ----- Message ----- From: Interface, [facility] Lab Results In Sent: 12/03/2023   5:37 AM EDT To: Redell JAYSON Burnet, MD

## 2023-12-23 ENCOUNTER — Ambulatory Visit
Admission: RE | Admit: 2023-12-23 | Discharge: 2023-12-23 | Disposition: A | Discharge status: Home / Self Care | Source: Ambulatory Visit | Attending: Urology | Admitting: Urology

## 2023-12-23 DIAGNOSIS — Z0389 Encounter for observation for other suspected diseases and conditions ruled out: Secondary | ICD-10-CM | POA: Insufficient documentation

## 2023-12-23 DIAGNOSIS — R972 Elevated prostate specific antigen [PSA]: Secondary | ICD-10-CM | POA: Insufficient documentation

## 2023-12-23 MED ORDER — FLOTUFOLASTAT F 18 GALLIUM 296-5846 MBQ/ML IV SOLN
7.9600 | Freq: Once | INTRAVENOUS | Status: AC
  Administered 2023-12-23: 7.96 via INTRAVENOUS
  Filled 2023-12-23: qty 8

## 2023-12-29 ENCOUNTER — Ambulatory Visit: Payer: Self-pay | Admitting: Urology

## 2023-12-29 DIAGNOSIS — C61 Malignant neoplasm of prostate: Secondary | ICD-10-CM

## 2023-12-30 NOTE — Telephone Encounter (Signed)
-----   Message from Redell JAYSON Burnet sent at 12/29/2023 11:06 AM EST ----- His PSMA PET scan is concerning for possible recurrence of prostate cancer locally within the prostate, there is no evidence of metastatic disease.  Please place referral to medical oncology so they  can discuss next steps and other treatment options.  Redell Burnet, MD 12/29/2023  ----- Message ----- From: Interface, Rad Results In Sent: 12/25/2023   5:29 PM EST To: Redell JAYSON Burnet, MD

## 2023-12-30 NOTE — Telephone Encounter (Signed)
 Called pt informed him of the information below. Pt voiced understanding. Referral for medical oncology placed.

## 2024-01-07 ENCOUNTER — Inpatient Hospital Stay

## 2024-01-07 ENCOUNTER — Other Ambulatory Visit: Payer: Self-pay

## 2024-01-07 ENCOUNTER — Inpatient Hospital Stay: Attending: Oncology | Admitting: Oncology

## 2024-01-07 VITALS — BP 182/92 | HR 84 | Temp 97.4°F | Resp 19 | Ht 72.0 in | Wt 234.2 lb

## 2024-01-07 DIAGNOSIS — R972 Elevated prostate specific antigen [PSA]: Secondary | ICD-10-CM

## 2024-01-07 NOTE — Progress Notes (Signed)
 Hematology/Oncology Consult note [facility]  Telephone:([phone] [phone]  Patient Care Team: Glover Lenis, MD as PCP - General (Family Medicine) Lenn Aran, MD as Radiation Oncologist (Radiation Oncology) Melanee Annah BROCKS, MD as Consulting Physician (Oncology)   Name of the patient: Jacob Boyer  [id number]  1945-08-04   Date of visit: 01/07/24  Diagnosis- 1.  H/o DVT involving right femoral vein 2. Recurrent prostate cancer  Chief complaint/ Reason for visit-referred for prostate cancer  Heme/Onc history: patient is a 78 year old African-American male with no prior history of any thromboembolic events.  He noticed superficial redness, pain and knot-like sensation in his right leg in June 2024 which persisted for about 2 to 3 weeks.  This was followed by right lower extremity ultrasound which showed an age indeterminant occlusive DVT involving the mid aspect of the duplicated division of the femoral vein.  Adjacent dominant division of femoral vein appears widely patent.  Chronic etiology is favored though technically is age indeterminate given lack of prior comparison.  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  Patient denies any history of trauma to the right lower extremity or prolonged immobilization.  He is a former-smoker.    Results of hypercoagulable workup including protein C, protein S Antithrombin III  levels, factor V Leiden prothrombin gene mutation negative.  No evidence of antiphospholipid antibody syndrome.  Interval history- Discussed the use of AI scribe software for clinical note transcription with the patient, who gave verbal consent to proceed.  History of Present Illness   Jacob Boyer is a 78 year old male with prostate cancer who  presents with rising PSA levels.  He was diagnosed with prostate cancer in November 2021 and underwent a biopsy within the current medical system. Following the diagnosis, he received brachytherapy in 2022, which initially resulted in a significant decrease in PSA levels.  His PSA levels have been monitored regularly. Two years ago, his PSA was 0.7, and one year ago, it increased to 0.7. However, over the past six months, his PSA has been rising again, with the most recent value being 2.6, recorded a month ago. A PSMA PET scan was conducted, revealing intense activity in the prostate gland.  He has a past medical history of a blood clot for which he was previously on Eliquis , but he is no longer taking this medication.     History of Present Illness  ECOG PS- 1 Pain scale- 0   Review of systems- Review of Systems  Constitutional:  Negative for chills, fever, malaise/fatigue and weight loss.  HENT:  Negative for congestion, ear discharge and nosebleeds.   Eyes:  Negative for blurred vision.  Respiratory:  Negative for cough, hemoptysis, sputum production, shortness of breath and wheezing.   Cardiovascular:  Negative for chest pain, palpitations, orthopnea and claudication.  Gastrointestinal:  Negative for abdominal pain, blood in stool, constipation, diarrhea, heartburn, melena, nausea and vomiting.  Genitourinary:  Negative for dysuria, flank pain, frequency, hematuria and urgency.  Musculoskeletal:  Negative for back pain, joint pain and myalgias.  Skin:  Negative for rash.  Neurological:  Negative for dizziness, tingling, focal weakness, seizures, weakness and headaches.  Endo/Heme/Allergies:  Does not bruise/bleed easily.  Psychiatric/Behavioral:  Negative for depression and suicidal ideas. The patient does not have insomnia.       No Known Allergies   Past  Medical History:  Diagnosis Date   Anxiety    Cancer (HCC)    Diabetes mellitus without complication (HCC)     Esophageal motility disorder    H/O cervical spine surgery    Hypertension    Hypotestosteronism    Prostate cancer (HCC)    Seasonal allergic rhinitis    Sleep apnea    HAS NOT USED CPAP IN OVER 20 YEARS     Past Surgical History:  Procedure Laterality Date   COLONOSCOPY WITH PROPOFOL  N/A 03/06/2015   Procedure: COLONOSCOPY WITH PROPOFOL ;  Surgeon: Gladis RAYMOND Mariner, MD;  Location: [facility]COPY;  Service: Endoscopy;  Laterality: N/A;   MOUTH SURGERY     RADIOACTIVE SEED IMPLANT N/A 04/16/2020   Procedure: RADIOACTIVE SEED IMPLANT/BRACHYTHERAPY IMPLANT;  Surgeon: Francisca Redell BROCKS, MD;  Location: [facility] ORS;  Service: Urology;  Laterality: N/A;   TONSILLECTOMY     AND ADENOIDS    Social History   Socioeconomic History   Marital status: Widowed    Spouse name: Not on file   Number of children: Not on file   Years of education: Not on file   Highest education level: Not on file  Occupational History   Not on file  Tobacco Use   Smoking status: Former    Current packs/day: 0.00    Average packs/day: 0.5 packs/day for 5.0 years (2.5 ttl pk-yrs)    Types: Cigarettes    Start date: 04/09/1990    Quit date: 04/10/1995    Years since quitting: 28.7    Passive exposure: Past   Smokeless tobacco: Never  Vaping Use   Vaping status: Never Used  Substance and Sexual Activity   Alcohol use: Yes    Comment: RARE   Drug use: No   Sexual activity: Yes    Birth control/protection: None  Other Topics Concern   Not on file  Social History Narrative   Not on file   Social Drivers of Health   Financial Resource Strain: Patient Declined (12/29/2022)   Received from [facility]   Overall Financial Resource Strain (CARDIA)    Difficulty of Paying Living Expenses: Patient declined  Food Insecurity: No Food Insecurity (01/07/2024)   Hunger Vital Sign    Worried About Running Out of Food in the Last Year: Never true    Ran Out of Food in the Last Year: Never true   Transportation Needs: No Transportation Needs (01/07/2024)   PRAPARE - Administrator, Civil Service (Medical): No    Lack of Transportation (Non-Medical): No  Physical Activity: Not on file  Stress: Not on file  Social Connections: Not on file  Intimate Partner Violence: Not At Risk (09/16/2022)   Humiliation, Afraid, Rape, and Kick questionnaire    Fear of Current or Ex-Partner: No    Emotionally Abused: No    Physically Abused: No    Sexually Abused: No    Family History  Problem Relation Age of Onset   Prostate cancer Neg Hx    Kidney cancer Neg Hx    Bladder Cancer Neg Hx      Current Outpatient Medications:    cholecalciferol (VITAMIN D) 25 MCG (1000 UNIT) tablet, Take 1,000 Units by mouth daily., Disp: , Rfl:    fluticasone (FLONASE) 50 MCG/ACT nasal spray, Place 1 spray into both nostrils as needed., Disp: , Rfl:    Insulin Glargine Solostar (LANTUS) 100 UNIT/ML Solostar Pen, Inject into the skin. Inject 50 Units subcutaneously once daily 30 units  Qam 25 units QPM, Disp: , Rfl:    valsartan (DIOVAN) 320 MG tablet, Take 320 mg by mouth daily. (Patient taking differently: Take 320 mg by mouth daily. Sometimes he takes it sometimes he does not; last time taken 01/04/24), Disp: , Rfl:    apixaban  (ELIQUIS ) 5 MG TABS tablet, Take 1 tablet (5 mg total) by mouth 2 (two) times daily. (Patient not taking: Reported on 01/07/2024), Disp: 180 tablet, Rfl: 1   atorvastatin (LIPITOR) 20 MG tablet, Take 20 mg by mouth daily. (Patient not taking: Reported on 01/07/2024), Disp: , Rfl:    Continuous Blood Gluc Receiver (FREESTYLE LIBRE 2 READER) DEVI, As directed (Patient not taking: Reported on 01/07/2024), Disp: 1 each, Rfl: 0   Continuous Blood Gluc Sensor (FREESTYLE LIBRE 2 SENSOR) MISC, 1 Piece by Does not apply route every 14 (fourteen) days. (Patient not taking: Reported on 01/07/2024), Disp: 2 each, Rfl: 3   empagliflozin (JARDIANCE) 25 MG TABS tablet, Take by mouth.  (Patient not taking: Reported on 01/07/2024), Disp: , Rfl:    insulin glargine-yfgn (SEMGLEE) 100 UNIT/ML Pen, Inject into the skin. (Patient not taking: Reported on 01/07/2024), Disp: , Rfl:    valsartan (DIOVAN) 160 MG tablet, Take 160 mg by mouth daily. (Patient not taking: Reported on 01/07/2024), Disp: , Rfl:   Physical exam:  Vitals:   01/07/24 1246  BP: (!) 159/100  Pulse: 85  Resp: 19  Temp: (!) 97.4 F (36.3 C)  TempSrc: Tympanic  SpO2: 96%  Weight: 234 lb 3.2 oz (106.2 kg)  Height: 6' (1.829 m)   Physical Exam Cardiovascular:     Rate and Rhythm: Normal rate and regular rhythm.     Heart sounds: Normal heart sounds.  Pulmonary:     Effort: Pulmonary effort is normal.     Breath sounds: Normal breath sounds.  Skin:    General: Skin is warm and dry.  Neurological:     Mental Status: He is alert and oriented to person, place, and time.      I have personally reviewed labs listed below:    Latest Ref Rng & Units 12/23/2021   12:00 AM  CMP  BUN 4 - 21 18      Creatinine 0.6 - 1.3 1.1         This result is from an external source.      Latest Ref Rng & Units 04/10/2020    3:17 PM  CBC  WBC 4.0 - 10.5 K/uL 6.5   Hemoglobin 13.0 - 17.0 g/dL 87.4   Hematocrit 60.9 - 52.0 % 36.6   Platelets 150 - 400 K/uL 279    I have personally reviewed Radiology images listed below: No images are attached to the encounter.  NM PET (PSMA) SKULL TO MID THIGH Result Date: 12/25/2023 EXAM: PROSTATE PET SKULL BASE TO MID THIGHS 12/23/2023 02:14:33 PM TECHNIQUE: RADIOPHARMACEUTICAL: 8.0 mCi F-18 piflufolastat (Pylarify) injected intravenously. PET imaging was obtained from skull vertex to mid thighs. Computed tomography was used for attenuation correction and localization. Fusion imaging was obtained. COMPARISON: None available. CLINICAL HISTORY: Prostate cancer, residual or recurrent disease suspected. FINDINGS: PROSTATE AND PROSTATE BED: Multiple brachytherapy seeds positioned  throughout the prostate gland. Centrally within the gland there is a focus of intense PSMA activity with SUV max equal to 11.1 on image 141. Difficult to discern if this is tumor activity or urinary activity centrally within the urethra. Favor tumor activity. LYMPH NODES: No radiotracer avid pelvic lymph nodes. No tracer avid abdominal  lymph nodes. BONES: No abnormal PSMA activity localizes to the bones. OTHER PET FINDINGS: Physiologic activity within the salivary glands, liver, spleen, kidneys, bowel, and urinary bladder. No liver metastasis. No pulmonary metastasis. IMPRESSION: 1. Intense PSMA activity centrally within the prostate gland (SUVmax 11.1), indeterminate between tumor and urethral urinary activity, with tumor favored. 2. No evidence of metastatic disease in pelvic or abdominal lymph nodes, liver, or lungs. Electronically signed by: Norleen Boxer MD 12/25/2023 05:28 PM EST RP Workstation: [id number]     Assessment and plan- Patient is a 78 y.o. male referred for concerns of recurrent prostate cancer  Assessment and Plan    Prostate cancer recurrence limited to prostate PSA increased from 0.2 to 2.6 over the past year, indicating progression. PSMA PET scan showed intense prostate activity, suggesting recurrence. Previous brachytherapy ineffective.  He is not a candidate for any further radiation therapy. Considered hormone therapy with Lupron or Eligard to block testosterone. Explained risks: fatigue, loss of sexual drive, hot flashes.  Patient does not wish to start ADT at this time.  He preferred to wait until January for further evaluation. - Order repeat PSA test mid-January. - Schedule follow-up appointment one week post-PSA test.    Visit Diagnosis 1. Rising PSA level      Dr. Annah Skene, MD, MPH [facility] [phone] 01/07/2024 12:53 PM

## 2024-01-07 NOTE — Progress Notes (Signed)
 urinary frequency and urgency wakes up him at night

## 2024-02-22 ENCOUNTER — Telehealth: Payer: Self-pay | Admitting: Oncology

## 2024-02-22 NOTE — Telephone Encounter (Signed)
 Pt called to confirm appt for 1/13 - confirmed date/time - LH

## 2024-03-01 ENCOUNTER — Inpatient Hospital Stay: Attending: Oncology

## 2024-03-01 DIAGNOSIS — I10 Essential (primary) hypertension: Secondary | ICD-10-CM | POA: Insufficient documentation

## 2024-03-01 DIAGNOSIS — Z9089 Acquired absence of other organs: Secondary | ICD-10-CM | POA: Insufficient documentation

## 2024-03-01 DIAGNOSIS — I82411 Acute embolism and thrombosis of right femoral vein: Secondary | ICD-10-CM | POA: Insufficient documentation

## 2024-03-01 DIAGNOSIS — R972 Elevated prostate specific antigen [PSA]: Secondary | ICD-10-CM

## 2024-03-01 DIAGNOSIS — Z87891 Personal history of nicotine dependence: Secondary | ICD-10-CM | POA: Diagnosis not present

## 2024-03-01 DIAGNOSIS — C61 Malignant neoplasm of prostate: Secondary | ICD-10-CM | POA: Insufficient documentation

## 2024-03-01 DIAGNOSIS — I471 Supraventricular tachycardia, unspecified: Secondary | ICD-10-CM | POA: Insufficient documentation

## 2024-03-01 DIAGNOSIS — Z7901 Long term (current) use of anticoagulants: Secondary | ICD-10-CM | POA: Insufficient documentation

## 2024-03-01 DIAGNOSIS — Z79899 Other long term (current) drug therapy: Secondary | ICD-10-CM | POA: Insufficient documentation

## 2024-03-01 DIAGNOSIS — Z86718 Personal history of other venous thrombosis and embolism: Secondary | ICD-10-CM | POA: Diagnosis not present

## 2024-03-01 LAB — CMP (CANCER CENTER ONLY)
ALT: 19 U/L (ref 0–44)
AST: 20 U/L (ref 15–41)
Albumin: 4.1 g/dL (ref 3.5–5.0)
Alkaline Phosphatase: 73 U/L (ref 38–126)
Anion gap: 10 (ref 5–15)
BUN: 20 mg/dL (ref 8–23)
CO2: 26 mmol/L (ref 22–32)
Calcium: 9.5 mg/dL (ref 8.9–10.3)
Chloride: 102 mmol/L (ref 98–111)
Creatinine: 1.22 mg/dL (ref 0.61–1.24)
GFR, Estimated: >60 mL/min
Glucose, Bld: 167 mg/dL — ABNORMAL HIGH (ref 70–99)
Potassium: 4.2 mmol/L (ref 3.5–5.1)
Sodium: 138 mmol/L (ref 135–145)
Total Bilirubin: 0.6 mg/dL (ref 0.0–1.2)
Total Protein: 6.9 g/dL (ref 6.5–8.1)

## 2024-03-01 LAB — CBC WITH DIFFERENTIAL (CANCER CENTER ONLY)
Abs Immature Granulocytes: 0.02 K/uL (ref 0.00–0.07)
Basophils Absolute: 0.1 K/uL (ref 0.0–0.1)
Basophils Relative: 1 %
Eosinophils Absolute: 0.3 K/uL (ref 0.0–0.5)
Eosinophils Relative: 4 %
HCT: 40.7 % (ref 39.0–52.0)
Hemoglobin: 13.4 g/dL (ref 13.0–17.0)
Immature Granulocytes: 0 %
Lymphocytes Relative: 25 %
Lymphs Abs: 1.6 K/uL (ref 0.7–4.0)
MCH: 27.3 pg (ref 26.0–34.0)
MCHC: 32.9 g/dL (ref 30.0–36.0)
MCV: 82.9 fL (ref 80.0–100.0)
Monocytes Absolute: 0.4 K/uL (ref 0.1–1.0)
Monocytes Relative: 7 %
Neutro Abs: 4 K/uL (ref 1.7–7.7)
Neutrophils Relative %: 63 %
Platelet Count: 253 K/uL (ref 150–400)
RBC: 4.91 MIL/uL (ref 4.22–5.81)
RDW: 14.9 % (ref 11.5–15.5)
WBC Count: 6.4 K/uL (ref 4.0–10.5)
nRBC: 0 % (ref 0.0–0.2)

## 2024-03-01 LAB — PSA: Prostatic Specific Antigen: 3.48 ng/mL (ref 0.00–4.00)

## 2024-03-08 ENCOUNTER — Inpatient Hospital Stay: Admitting: Oncology

## 2024-03-08 ENCOUNTER — Encounter: Payer: Self-pay | Admitting: Oncology

## 2024-03-08 VITALS — BP 160/90 | HR 81 | Temp 98.0°F | Resp 16 | Wt 238.0 lb

## 2024-03-08 DIAGNOSIS — C61 Malignant neoplasm of prostate: Secondary | ICD-10-CM | POA: Diagnosis not present

## 2024-03-08 DIAGNOSIS — R972 Elevated prostate specific antigen [PSA]: Secondary | ICD-10-CM

## 2024-03-08 NOTE — Progress Notes (Signed)
 "    Hematology/Oncology Consult note [facility]  Telephone:([phone] [phone]  Patient Care Team: Glover Lenis, MD as PCP - General (Family Medicine) Lenn Aran, MD as Radiation Oncologist (Radiation Oncology) Melanee Annah BROCKS, MD as Consulting Physician (Oncology)   Name of the patient: Jacob Boyer  [id number]  Dec 03, 1945   Date of visit: 03/08/24  Diagnosis- recurrent prostate cancer  H/o DVT  Chief complaint/ Reason for visit- routine f/u of prostate cancer  Heme/Onc history:  patient is a 79 year old African-American male with no prior history of any thromboembolic events.  He noticed superficial redness, pain and knot-like sensation in his right leg in June 2024 which persisted for about 2 to 3 weeks.  This was followed by right lower extremity ultrasound which showed an age indeterminant occlusive DVT involving the mid aspect of the duplicated division of the femoral vein.  Adjacent dominant division of femoral vein appears widely patent.  Chronic etiology is favored though technically is age indeterminate given lack of prior comparison.  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  Patient denies any history of trauma to the right lower extremity or prolonged immobilization.  He is a former-smoker.    Results of hypercoagulable workup including protein C, protein S Antithrombin III  levels, factor V Leiden prothrombin gene mutation negative.  No evidence of antiphospholipid antibody syndrome.  Prostate cancer history: He was diagnosed with prostate cancer in November 2021. Prostate MRI with PI-RADS 4 lesion in transition zone, volume only 21 g, fusion biopsy showed high-volume unfavorable intermediate risk disease in ROI, staging imaging negative. Treated with  brachytherapy February 2022, he deferred adjuvant ADT   His PSA levels have been monitored regularly. Two years ago, his PSA was 0.7, and one year ago, it increased to 0.7. However, over the past six months, his PSA has been rising again, with the most recent value being 2.6, recorded a month ago. A PSMA PET scan was conducted, revealing intense activity in the prostate gland.  He is not a candidate for surgery or recurrent radiation therapy.  He is not presently on ADT. PSA doubling time 7.7 months  Interval history- Discussed the use of AI scribe software for clinical note transcription with the patient, who gave verbal consent to proceed.  History of Present Illness   Jacob Boyer is a 79 year old male with recurrent, non-metastatic prostate cancer who presents for oncology follow-up regarding rising PSA.   He is asymptomatic from his prostate cancer, denying symptoms suggestive of metastatic disease. He specifically denies left leg symptoms.  He expresses significant concern regarding quality of life, particularly apprehension about the side effects of androgen deprivation therapy, including fatigue, decreased muscle mass, reduced exercise tolerance, hot flashes, mood changes, and loss of sexual function. He notes a disconnect between his mental and physical capabilities and is reluctant to accept further decline in sexual and physical vitality.       ECOG PS- 1 Pain scale- 0  Review of systems- Review of Systems  Constitutional:  Negative for chills, fever, malaise/fatigue and weight loss.  HENT:  Negative for congestion, ear discharge and nosebleeds.   Eyes:  Negative for blurred vision.  Respiratory:  Negative for cough, hemoptysis, sputum production, shortness of breath and wheezing.   Cardiovascular:  Negative for chest pain, palpitations, orthopnea and claudication.  Gastrointestinal:  Negative for abdominal pain, blood  in stool, constipation, diarrhea, heartburn, melena,  nausea and vomiting.  Genitourinary:  Negative for dysuria, flank pain, frequency, hematuria and urgency.  Musculoskeletal:  Negative for back pain, joint pain and myalgias.  Skin:  Negative for rash.  Neurological:  Negative for dizziness, tingling, focal weakness, seizures, weakness and headaches.  Endo/Heme/Allergies:  Does not bruise/bleed easily.  Psychiatric/Behavioral:  Negative for depression and suicidal ideas. The patient does not have insomnia.       Allergies[1]   Past Medical History:  Diagnosis Date   Anxiety    Cancer (HCC)    Diabetes mellitus without complication (HCC)    Esophageal motility disorder    H/O cervical spine surgery    Hypertension    Hypotestosteronism    Prostate cancer (HCC)    Seasonal allergic rhinitis    Sleep apnea    HAS NOT USED CPAP IN OVER 20 YEARS     Past Surgical History:  Procedure Laterality Date   COLONOSCOPY WITH PROPOFOL  N/A 03/06/2015   Procedure: COLONOSCOPY WITH PROPOFOL ;  Surgeon: Gladis RAYMOND Mariner, MD;  Location: [facility] ENDOSCOPY;  Service: Endoscopy;  Laterality: N/A;   MOUTH SURGERY     RADIOACTIVE SEED IMPLANT N/A 04/16/2020   Procedure: RADIOACTIVE SEED IMPLANT/BRACHYTHERAPY IMPLANT;  Surgeon: Francisca Redell BROCKS, MD;  Location: [facility] ORS;  Service: Urology;  Laterality: N/A;   TONSILLECTOMY     AND ADENOIDS    Social History   Socioeconomic History   Marital status: Widowed    Spouse name: Not on file   Number of children: Not on file   Years of education: Not on file   Highest education level: Not on file  Occupational History   Not on file  Tobacco Use   Smoking status: Former    Current packs/day: 0.00    Average packs/day: 0.5 packs/day for 5.0 years (2.5 ttl pk-yrs)    Types: Cigarettes    Start date: 04/09/1990    Quit date: 04/10/1995    Years since quitting: 28.9    Passive exposure: Past   Smokeless tobacco: Never  Vaping Use   Vaping status: Never Used  Substance and Sexual Activity   Alcohol  use: Yes    Comment: RARE   Drug use: No   Sexual activity: Yes    Birth control/protection: None  Other Topics Concern   Not on file  Social History Narrative   Not on file   Social Drivers of Health   Tobacco Use: Medium Risk (03/08/2024)   Patient History    Smoking Tobacco Use: Former    Smokeless Tobacco Use: Never    Passive Exposure: Past  Physicist, Medical Strain: Patient Declined (12/29/2022)   Received from [facility]   Overall Financial Resource Strain (CARDIA)    Difficulty of Paying Living Expenses: Patient declined  Food Insecurity: No Food Insecurity (01/07/2024)   Epic    Worried About Radiation Protection Practitioner of Food in the Last Year: Never true    Ran Out of Food in the Last Year: Never true  Transportation Needs: No Transportation Needs (01/07/2024)   Epic    Lack of Transportation (Medical): No    Lack of Transportation (Non-Medical): No  Physical Activity: Not on file  Stress: Not on file  Social Connections: Not on file  Intimate Partner Violence: Not At Risk (09/16/2022)   Humiliation, Afraid, Rape, and Kick questionnaire    Fear of Current or Ex-Partner: No    Emotionally Abused: No    Physically Abused:  No    Sexually Abused: No  Depression (PHQ2-9): Low Risk (03/08/2024)   Depression (PHQ2-9)    PHQ-2 Score: 0  Alcohol Screen: Not on file  Housing: Low Risk (01/07/2024)   Epic    Unable to Pay for Housing in the Last Year: No    Number of Times Moved in the Last Year: 0    Homeless in the Last Year: No  Utilities: Not At Risk (01/07/2024)   Epic    Threatened with loss of utilities: No  Health Literacy: Not on file    Family History  Problem Relation Age of Onset   Prostate cancer Neg Hx    Kidney cancer Neg Hx    Bladder Cancer Neg Hx     Current Medications[2]  Physical exam:  Vitals:   03/08/24 1326  BP: (!) 160/90  Pulse: 81  Resp: 16  Temp: 98 F (36.7 C)  TempSrc: Tympanic  SpO2: 98%  Weight: 238 lb (108 kg)    Physical Exam Cardiovascular:     Rate and Rhythm: Normal rate and regular rhythm.     Heart sounds: Normal heart sounds.  Pulmonary:     Effort: Pulmonary effort is normal.     Breath sounds: Normal breath sounds.  Skin:    General: Skin is warm and dry.  Neurological:     Mental Status: He is alert and oriented to person, place, and time.      I have personally reviewed labs listed below:    Latest Ref Rng & Units 03/01/2024   12:31 PM  CMP  Glucose 70 - 99 mg/dL 832   BUN 8 - 23 mg/dL 20   Creatinine 9.38 - 1.24 mg/dL 8.77   Sodium 864 - 854 mmol/L 138   Potassium 3.5 - 5.1 mmol/L 4.2   Chloride 98 - 111 mmol/L 102   CO2 22 - 32 mmol/L 26   Calcium 8.9 - 10.3 mg/dL 9.5   Total Protein 6.5 - 8.1 g/dL 6.9   Total Bilirubin 0.0 - 1.2 mg/dL 0.6   Alkaline Phos 38 - 126 U/L 73   AST 15 - 41 U/L 20   ALT 0 - 44 U/L 19       Latest Ref Rng & Units 03/01/2024   12:31 PM  CBC  WBC 4.0 - 10.5 K/uL 6.4   Hemoglobin 13.0 - 17.0 g/dL 86.5   Hematocrit 60.9 - 52.0 % 40.7   Platelets 150 - 400 K/uL 253     Assessment and plan- Patient is a 79 y.o. male here for f/u of recurrent prostate cancer  Assessment and Plan    Non-metastatic prostate cancer castrate sensitive with local recurrence in the prostate gland.  Rising PSA, currently 3.48 ng/mL, PSA doubling time 7.7 months. PSMA PET shows local recurrent activity in the prostate gland without metastasis.  He is not a candidate for surgery or recurrent radiation treatment at this time.  Disease progression moderate, not rapid. ADT with Eligard discussed, including side effects and intermittent therapy. Decision to defer ADT based on patient preference - Ordered PSA testing in three and six months. - Scheduled follow-up in six months. - Provided counseling on ADT (Eligard), including mechanism, side effects, quality of life impact, intermittent therapy, and discontinuation if intolerable.         Visit Diagnosis 1.  Recurrent prostate cancer (HCC)      Dr. Annah Skene, MD, MPH [facility] [phone] 03/08/2024 3:59 PM                   [  1] No Known Allergies [2]  Current Outpatient Medications:    cholecalciferol (VITAMIN D) 25 MCG (1000 UNIT) tablet, Take 1,000 Units by mouth daily., Disp: , Rfl:    Continuous Blood Gluc Receiver (FREESTYLE LIBRE 2 READER) DEVI, As directed, Disp: 1 each, Rfl: 0   fluticasone (FLONASE) 50 MCG/ACT nasal spray, Place 1 spray into both nostrils as needed., Disp: , Rfl:    Insulin Glargine Solostar (LANTUS) 100 UNIT/ML Solostar Pen, Inject into the skin. Inject 50 Units subcutaneously once daily 30 units Qam 25 units QPM, Disp: , Rfl:    valsartan (DIOVAN) 320 MG tablet, Take 320 mg by mouth daily., Disp: , Rfl:    atorvastatin (LIPITOR) 20 MG tablet, Take 20 mg by mouth daily. (Patient not taking: Reported on 03/08/2024), Disp: , Rfl:    Continuous Blood Gluc Sensor (FREESTYLE LIBRE 2 SENSOR) MISC, 1 Piece by Does not apply route every 14 (fourteen) days. (Patient not taking: Reported on 03/08/2024), Disp: 2 each, Rfl: 3   empagliflozin (JARDIANCE) 25 MG TABS tablet, Take by mouth. (Patient not taking: Reported on 03/08/2024), Disp: , Rfl:   "

## 2024-03-08 NOTE — Progress Notes (Signed)
 Pt and daughter in for follow up and test results.  Pt anxious about results.

## 2024-06-06 ENCOUNTER — Inpatient Hospital Stay

## 2024-09-05 ENCOUNTER — Inpatient Hospital Stay

## 2024-09-05 ENCOUNTER — Inpatient Hospital Stay: Admitting: Oncology
